# Patient Record
Sex: Female | Born: 1950 | Race: Black or African American | Hispanic: No | Marital: Married | Smoking: Never smoker
Health system: Southern US, Community
[De-identification: ages and names within clinical notes are randomized; demographics above are authoritative.]

## PROBLEM LIST (undated history)

## (undated) DIAGNOSIS — M1990 Unspecified osteoarthritis, unspecified site: Secondary | ICD-10-CM

## (undated) DIAGNOSIS — Z780 Asymptomatic menopausal state: Secondary | ICD-10-CM

## (undated) DIAGNOSIS — E119 Type 2 diabetes mellitus without complications: Secondary | ICD-10-CM

## (undated) DIAGNOSIS — I10 Essential (primary) hypertension: Secondary | ICD-10-CM

## (undated) HISTORY — DX: Asymptomatic menopausal state: Z78.0

## (undated) HISTORY — PX: TUBAL LIGATION: SHX77

## (undated) HISTORY — DX: Unspecified osteoarthritis, unspecified site: M19.90

## (undated) HISTORY — DX: Essential (primary) hypertension: I10

## (undated) HISTORY — DX: Type 2 diabetes mellitus without complications: E11.9

---

## 1999-09-01 ENCOUNTER — Other Ambulatory Visit: Admission: RE | Admit: 1999-09-01 | Discharge: 1999-09-01 | Payer: Self-pay | Admitting: Emergency Medicine

## 2001-08-10 ENCOUNTER — Encounter: Admission: RE | Admit: 2001-08-10 | Discharge: 2001-08-10 | Payer: Self-pay | Admitting: Emergency Medicine

## 2001-08-10 ENCOUNTER — Encounter: Payer: Self-pay | Admitting: Emergency Medicine

## 2002-12-14 ENCOUNTER — Other Ambulatory Visit: Admission: RE | Admit: 2002-12-14 | Discharge: 2002-12-14 | Payer: Self-pay | Admitting: Emergency Medicine

## 2004-04-06 ENCOUNTER — Other Ambulatory Visit: Admission: RE | Admit: 2004-04-06 | Discharge: 2004-04-06 | Payer: Self-pay | Admitting: Emergency Medicine

## 2004-04-22 ENCOUNTER — Encounter: Admission: RE | Admit: 2004-04-22 | Discharge: 2004-04-22 | Payer: Self-pay | Admitting: Emergency Medicine

## 2006-04-08 ENCOUNTER — Other Ambulatory Visit: Admission: RE | Admit: 2006-04-08 | Discharge: 2006-04-08 | Payer: Self-pay | Admitting: Emergency Medicine

## 2006-05-10 ENCOUNTER — Encounter: Admission: RE | Admit: 2006-05-10 | Discharge: 2006-05-10 | Payer: Self-pay | Admitting: Emergency Medicine

## 2011-05-14 ENCOUNTER — Ambulatory Visit (INDEPENDENT_AMBULATORY_CARE_PROVIDER_SITE_OTHER): Payer: BC Managed Care – PPO

## 2011-05-14 ENCOUNTER — Inpatient Hospital Stay (INDEPENDENT_AMBULATORY_CARE_PROVIDER_SITE_OTHER)
Admission: RE | Admit: 2011-05-14 | Discharge: 2011-05-14 | Disposition: A | Discharge status: Home / Self Care | Payer: BC Managed Care – PPO | Source: Ambulatory Visit | Attending: Family Medicine | Admitting: Family Medicine

## 2011-05-14 DIAGNOSIS — T07XXXA Unspecified multiple injuries, initial encounter: Secondary | ICD-10-CM

## 2011-05-14 DIAGNOSIS — IMO0002 Reserved for concepts with insufficient information to code with codable children: Secondary | ICD-10-CM

## 2011-11-04 ENCOUNTER — Institutional Professional Consult (permissible substitution): Payer: Self-pay | Admitting: Cardiology

## 2011-12-02 ENCOUNTER — Institutional Professional Consult (permissible substitution): Payer: Self-pay | Admitting: Cardiology

## 2011-12-02 ENCOUNTER — Encounter: Payer: Self-pay | Admitting: *Deleted

## 2012-11-07 ENCOUNTER — Other Ambulatory Visit: Payer: Self-pay | Admitting: Obstetrics and Gynecology

## 2012-11-07 DIAGNOSIS — R928 Other abnormal and inconclusive findings on diagnostic imaging of breast: Secondary | ICD-10-CM

## 2012-11-14 ENCOUNTER — Ambulatory Visit
Admission: RE | Admit: 2012-11-14 | Discharge: 2012-11-14 | Disposition: A | Discharge status: Home / Self Care | Payer: BC Managed Care – PPO | Source: Ambulatory Visit | Attending: Obstetrics and Gynecology | Admitting: Obstetrics and Gynecology

## 2012-11-14 ENCOUNTER — Other Ambulatory Visit: Payer: Self-pay | Admitting: Obstetrics and Gynecology

## 2012-11-14 DIAGNOSIS — R928 Other abnormal and inconclusive findings on diagnostic imaging of breast: Secondary | ICD-10-CM

## 2012-11-24 ENCOUNTER — Ambulatory Visit
Admission: RE | Admit: 2012-11-24 | Discharge: 2012-11-24 | Disposition: A | Discharge status: Home / Self Care | Payer: BC Managed Care – PPO | Source: Ambulatory Visit | Attending: Obstetrics and Gynecology | Admitting: Obstetrics and Gynecology

## 2012-11-24 DIAGNOSIS — R928 Other abnormal and inconclusive findings on diagnostic imaging of breast: Secondary | ICD-10-CM

## 2013-04-16 ENCOUNTER — Observation Stay (HOSPITAL_COMMUNITY)
Admission: EM | Admit: 2013-04-16 | Discharge: 2013-04-18 | Disposition: A | Discharge status: Home / Self Care | Payer: BC Managed Care – PPO | Attending: Internal Medicine | Admitting: Internal Medicine

## 2013-04-16 ENCOUNTER — Encounter (HOSPITAL_COMMUNITY): Payer: Self-pay | Admitting: *Deleted

## 2013-04-16 ENCOUNTER — Emergency Department (HOSPITAL_COMMUNITY): Payer: BC Managed Care – PPO

## 2013-04-16 DIAGNOSIS — I10 Essential (primary) hypertension: Secondary | ICD-10-CM | POA: Diagnosis present

## 2013-04-16 DIAGNOSIS — R42 Dizziness and giddiness: Principal | ICD-10-CM | POA: Diagnosis present

## 2013-04-16 DIAGNOSIS — E119 Type 2 diabetes mellitus without complications: Secondary | ICD-10-CM | POA: Diagnosis present

## 2013-04-16 DIAGNOSIS — N289 Disorder of kidney and ureter, unspecified: Secondary | ICD-10-CM | POA: Insufficient documentation

## 2013-04-16 DIAGNOSIS — M129 Arthropathy, unspecified: Secondary | ICD-10-CM | POA: Insufficient documentation

## 2013-04-16 DIAGNOSIS — Z794 Long term (current) use of insulin: Secondary | ICD-10-CM | POA: Insufficient documentation

## 2013-04-16 DIAGNOSIS — Z79899 Other long term (current) drug therapy: Secondary | ICD-10-CM | POA: Insufficient documentation

## 2013-04-16 DIAGNOSIS — E1169 Type 2 diabetes mellitus with other specified complication: Secondary | ICD-10-CM | POA: Insufficient documentation

## 2013-04-16 DIAGNOSIS — E162 Hypoglycemia, unspecified: Secondary | ICD-10-CM

## 2013-04-16 LAB — POCT I-STAT, CHEM 8
Creatinine, Ser: 1.3 mg/dL — ABNORMAL HIGH (ref 0.50–1.10)
Glucose, Bld: 58 mg/dL — ABNORMAL LOW (ref 70–99)
Hemoglobin: 13.3 g/dL (ref 12.0–15.0)
Sodium: 142 mEq/L (ref 135–145)
TCO2: 29 mmol/L (ref 0–100)

## 2013-04-16 LAB — URINE MICROSCOPIC-ADD ON

## 2013-04-16 LAB — URINALYSIS, ROUTINE W REFLEX MICROSCOPIC
Bilirubin Urine: NEGATIVE
Hgb urine dipstick: NEGATIVE
Protein, ur: NEGATIVE mg/dL
Urobilinogen, UA: 0.2 mg/dL (ref 0.0–1.0)

## 2013-04-16 MED ORDER — MECLIZINE HCL 25 MG PO TABS
25.0000 mg | ORAL_TABLET | Freq: Once | ORAL | Status: AC
  Administered 2013-04-16: 25 mg via ORAL
  Filled 2013-04-16: qty 1

## 2013-04-16 NOTE — ED Notes (Signed)
Pt c/o dizziness and feeling off balance that began this afternoon; denies nausea; pt states the dizziness is worse upon movement.

## 2013-04-16 NOTE — ED Provider Notes (Signed)
History     CSN: [id number]  Arrival date & time 04/16/13  2038   First MD Initiated Contact with Patient 04/16/13 2307      Chief Complaint  Patient presents with  . Dizziness    (Consider location/radiation/quality/duration/timing/severity/associated sxs/prior treatment) The history is provided by the patient.   62 year old female had onset about 4:30 PM of feeling dizzy. She describes a dizzy feeling as being off balance and she tended to fall to the right. She has not actually fallen. There is no associated nausea or vomiting. She denies any headache or ear pain or decreased hearing or tinnitus. Dizziness is worse with movement. Nothing makes it better. Around the same time, she developed a mild pain across her lower back with associated dysuria. She denies urinary urgency, frequency, tenesmus. Pain is mild and she rated at 1/10. Nothing makes it better nothing makes it worse. She has not taken anything for any of these symptoms. She's concerned because similar symptoms in the past were due to urinary tract infection.  Past Medical History  Diagnosis Date  . DM (diabetes mellitus)   . HTN (hypertension)   . Postmenopausal   . Arthritis     Past Surgical History  Procedure Laterality Date  . Tubal ligation      1982    No family history on file.  History  Substance Use Topics  . Smoking status: Never Smoker   . Smokeless tobacco: Never Used  . Alcohol Use: No    OB History   Grav Para Term Preterm Abortions TAB SAB Ect Mult Living                  Review of Systems  All other systems reviewed and are negative.    Allergies  Fluticasone propionate and Prinivil  Home Medications   Current Outpatient Rx  Name  Route  Sig  Dispense  Refill  . cetirizine (ZYRTEC) 10 MG tablet   Oral   Take 10 mg by mouth daily.         . insulin lispro protamine-insulin lispro (HUMALOG 75/25) (75-25) 100 UNIT/ML SUSP   Subcutaneous   Inject 40-45 Units into the skin 2  (two) times daily with a meal. 40 units in the AM  45 units in the PM         . metFORMIN (GLUCOPHAGE) 500 MG tablet   Oral   Take 500 mg by mouth 2 (two) times daily with a meal.         . omega-3 acid ethyl esters (LOVAZA) 1 G capsule   Oral   Take 1 g by mouth 2 (two) times daily.         . Losartan Potassium (COZAAR PO)   Oral   Take by mouth daily.         . Multiple Vitamin (MULTIVITAMIN WITH MINERALS) TABS   Oral   Take 1 tablet by mouth daily.           BP 169/88  Pulse 74  Temp(Src) 97.9 F (36.6 C) (Oral)  Resp 20  Ht 5\' 5"  (1.651 m)  Wt 254 lb (115.214 kg)  BMI 42.27 kg/m2  SpO2 100%  Physical Exam  Nursing note and vitals reviewed.  62 year old female, resting comfortably and in no acute distress. Vital signs are significant for hypertension with blood pressure 169/88. Oxygen saturation is 100%, which is normal. Head is normocephalic and atraumatic. PERRLA, EOMI. Oropharynx is clear. Neck is nontender and  supple without adenopathy or JVD. Back is nontender and there is no CVA tenderness. Lungs are clear without rales, wheezes, or rhonchi. Chest is nontender. Heart has regular rate and rhythm without murmur. Abdomen is soft, flat, nontender without masses or hepatosplenomegaly and peristalsis is normoactive. Extremities have no cyanosis or edema, full range of motion is present. Skin is warm and dry without rash. Neurologic: Mental status is normal, cranial nerves are intact, there are no motor or sensory deficits. Finger to nose testing is normal. Dizziness is reproduced by head movement. Romberg is positive for tendency to fall to the right.  ED Course  Procedures (including critical care time)  Results for orders placed during the hospital encounter of 04/16/13  GLUCOSE, CAPILLARY      Result Value Range   Glucose-Capillary 78  70 - 99 mg/dL  URINALYSIS, ROUTINE W REFLEX MICROSCOPIC      Result Value Range   Color, Urine YELLOW  YELLOW    APPearance CLEAR  CLEAR   Specific Gravity, Urine 1.016  1.005 - 1.030   pH 8.0  5.0 - 8.0   Glucose, UA NEGATIVE  NEGATIVE mg/dL   Hgb urine dipstick NEGATIVE  NEGATIVE   Bilirubin Urine NEGATIVE  NEGATIVE   Ketones, ur NEGATIVE  NEGATIVE mg/dL   Protein, ur NEGATIVE  NEGATIVE mg/dL   Urobilinogen, UA 0.2  0.0 - 1.0 mg/dL   Nitrite NEGATIVE  NEGATIVE   Leukocytes, UA SMALL (*) NEGATIVE  GLUCOSE, CAPILLARY      Result Value Range   Glucose-Capillary 32 (*) 70 - 99 mg/dL   Comment 1 Documented in Chart     Comment 2 Notify RN    URINE MICROSCOPIC-ADD ON      Result Value Range   Squamous Epithelial / LPF RARE  RARE   WBC, UA 3-6  <3 WBC/hpf   RBC / HPF 0-2  <3 RBC/hpf   Bacteria, UA RARE  RARE  BASIC METABOLIC PANEL      Result Value Range   Sodium 139  135 - 145 mEq/L   Potassium 3.0 (*) 3.5 - 5.1 mEq/L   Chloride 98  96 - 112 mEq/L   CO2 28  19 - 32 mEq/L   Glucose, Bld 42 (*) 70 - 99 mg/dL   BUN 26 (*) 6 - 23 mg/dL   Creatinine, Ser 1.61 (*) 0.50 - 1.10 mg/dL   Calcium 09.6  8.4 - 04.5 mg/dL   GFR calc non Af Amer 48 (*) >90 mL/min   GFR calc Af Amer 56 (*) >90 mL/min  GLUCOSE, CAPILLARY      Result Value Range   Glucose-Capillary 109 (*) 70 - 99 mg/dL  POCT I-STAT, CHEM 8      Result Value Range   Sodium 142  135 - 145 mEq/L   Potassium 3.5  3.5 - 5.1 mEq/L   Chloride 103  96 - 112 mEq/L   BUN 27 (*) 6 - 23 mg/dL   Creatinine, Ser 4.09 (*) 0.50 - 1.10 mg/dL   Glucose, Bld 58 (*) 70 - 99 mg/dL   Calcium, Ion 8.11  9.14 - 1.23 mmol/L   TCO2 29  0 - 100 mmol/L   Hemoglobin 13.3  12.0 - 15.0 g/dL   HCT 78.2  95.6 - 21.3 %   Ct Head Wo Contrast  04/16/2013   *RADIOLOGY REPORT*  Clinical Data: Dizziness.  CT HEAD WITHOUT CONTRAST  Technique:  Contiguous axial images were obtained from the base of the  skull through the vertex without contrast.  Comparison: No priors.  Findings: Faint physiologic calcifications in the basal ganglia bilaterally.  Prominent dural  calcifications in the falx cerebri. No acute intracranial abnormalities.  Specifically, no evidence of acute intracranial hemorrhage, no definite findings of acute/subacute cerebral ischemia, no mass, mass effect, hydrocephalus or abnormal intra or extra-axial fluid collections. Visualized paranasal sinuses and mastoids are well pneumatized, with exception of some mild multifocal mucosal thickening throughout the ethmoid sinuses bilaterally (right greater than left).  No acute displaced skull fractures are identified.  IMPRESSION: 1.  No acute intracranial abnormalities. 2.  The appearance of brain is normal. 3.  Mild paranasal sinus disease without acute features, as above.   Original Report Authenticated By: Trudie Reed, M.D.      Date: 04/16/2013  Rate: 73  Rhythm: normal sinus rhythm  QRS Axis: normal  Intervals: normal  ST/T Wave abnormalities: nonspecific T wave changes  Conduction Disutrbances:none  Narrative Interpretation: Left atrial hypertrophy, nonspecific T wave flattening. No prior ECG available for comparison.  Old EKG Reviewed: none available  1. Vertigo   2. Hypoglycemia   3. Renal insufficiency       MDM  Vertigo which has components of both central and peripheral vertigo. She will be given a therapeutic trial of meclizine and CT of the head obtained. I suspect that she will need MRI to definitively determine whether she has had a posterior circulation stroke. Initial workup at [facility] is back and she has mild renal insufficiency with BUN of 27 creatinine 1.3 and no prior values to compare with. Blood sugar was 58 on i-STAT and 78 on CBG testing. CBG will be repeated. Urinalysis will need to be obtained to rule out urinary tract infection.   repeat blood sugars come back 32. She is given IV dextrose and given a snack high blood sugars come up to 109. She is also given a dose of meclizine. Following this, she feels somewhat improved but not back to baseline. She will need  to be admitted under observation status in order to get an MRI scan to rule out posterior circulation stroke. Case is discussed with Dr. Eben Burow of [facility] who agrees to admit the patient.       Dione Booze, MD 04/17/13 [phone]

## 2013-04-17 ENCOUNTER — Observation Stay (HOSPITAL_COMMUNITY): Payer: BC Managed Care – PPO

## 2013-04-17 DIAGNOSIS — R42 Dizziness and giddiness: Secondary | ICD-10-CM | POA: Diagnosis present

## 2013-04-17 DIAGNOSIS — E119 Type 2 diabetes mellitus without complications: Secondary | ICD-10-CM

## 2013-04-17 DIAGNOSIS — I10 Essential (primary) hypertension: Secondary | ICD-10-CM | POA: Diagnosis present

## 2013-04-17 DIAGNOSIS — N289 Disorder of kidney and ureter, unspecified: Secondary | ICD-10-CM

## 2013-04-17 LAB — BASIC METABOLIC PANEL
GFR calc Af Amer: 56 mL/min — ABNORMAL LOW (ref >90)
GFR calc non Af Amer: 48 mL/min — ABNORMAL LOW (ref >90)
Potassium: 3 mEq/L — ABNORMAL LOW (ref 3.5–5.1)
Sodium: 139 mEq/L (ref 135–145)

## 2013-04-17 LAB — GLUCOSE, CAPILLARY: Glucose-Capillary: 207 mg/dL — ABNORMAL HIGH (ref 70–99)

## 2013-04-17 MED ORDER — SODIUM CHLORIDE 0.9 % IV SOLN: 250.0000 mL | INTRAVENOUS | Status: DC | PRN

## 2013-04-17 MED ORDER — SODIUM CHLORIDE 0.9 % IJ SOLN: 3.0000 mL | INTRAMUSCULAR | Status: DC | PRN

## 2013-04-17 MED ORDER — ENOXAPARIN SODIUM 40 MG/0.4ML ~~LOC~~ SOLN
40.0000 mg | SUBCUTANEOUS | Status: DC
  Administered 2013-04-17: 40 mg via SUBCUTANEOUS
  Filled 2013-04-17 (×2): qty 0.4

## 2013-04-17 MED ORDER — INSULIN ASPART 100 UNIT/ML ~~LOC~~ SOLN
0.0000 [IU] | Freq: Three times a day (TID) | SUBCUTANEOUS | Status: DC
  Administered 2013-04-17: 1 [IU] via SUBCUTANEOUS
  Administered 2013-04-17: 2 [IU] via SUBCUTANEOUS
  Administered 2013-04-17: 3 [IU] via SUBCUTANEOUS
  Administered 2013-04-18: 2 [IU] via SUBCUTANEOUS

## 2013-04-17 MED ORDER — POTASSIUM CHLORIDE CRYS ER 20 MEQ PO TBCR
40.0000 meq | EXTENDED_RELEASE_TABLET | Freq: Two times a day (BID) | ORAL | Status: AC
  Administered 2013-04-17: 40 meq via ORAL
  Filled 2013-04-17 (×2): qty 2

## 2013-04-17 MED ORDER — INSULIN ASPART 100 UNIT/ML ~~LOC~~ SOLN
3.0000 [IU] | Freq: Three times a day (TID) | SUBCUTANEOUS | Status: DC
  Administered 2013-04-17 – 2013-04-18 (×4): 3 [IU] via SUBCUTANEOUS

## 2013-04-17 MED ORDER — SODIUM CHLORIDE 0.9 % IJ SOLN
3.0000 mL | Freq: Two times a day (BID) | INTRAMUSCULAR | Status: DC
  Administered 2013-04-17: 3 mL via INTRAVENOUS

## 2013-04-17 MED ORDER — LOSARTAN POTASSIUM 50 MG PO TABS
50.0000 mg | ORAL_TABLET | Freq: Every day | ORAL | Status: DC
  Administered 2013-04-17: 50 mg via ORAL
  Filled 2013-04-17 (×2): qty 1

## 2013-04-17 MED ORDER — SODIUM CHLORIDE 0.9 % IV SOLN: INTRAVENOUS | Status: DC

## 2013-04-17 MED ORDER — DEXTROSE 50 % IV SOLN
12.5000 g | Freq: Once | INTRAVENOUS | Status: AC
  Administered 2013-04-17: 12.5 g via INTRAVENOUS
  Filled 2013-04-17: qty 50

## 2013-04-17 MED ORDER — ALUM & MAG HYDROXIDE-SIMETH 200-200-20 MG/5ML PO SUSP
30.0000 mL | ORAL | Status: DC | PRN
  Administered 2013-04-17: 30 mL via ORAL
  Filled 2013-04-17: qty 30

## 2013-04-17 MED ORDER — ASPIRIN EC 81 MG PO TBEC
81.0000 mg | DELAYED_RELEASE_TABLET | Freq: Every day | ORAL | Status: DC
  Administered 2013-04-17: 81 mg via ORAL
  Filled 2013-04-17 (×2): qty 1

## 2013-04-17 MED ORDER — LORAZEPAM 2 MG/ML IJ SOLN
1.0000 mg | Freq: Once | INTRAMUSCULAR | Status: DC
  Filled 2013-04-17: qty 1

## 2013-04-17 NOTE — H&P (Signed)
History and Physical  Diane Jordan OZH:[id number] DOB: [birth year] DOA: 04/16/2013  Referring physician: Dr. Preston Fleeting PCP: Darrow Bussing, MD   Chief Complaint: Vertigo   HPI: Patient is a 62 year old female with past medical history of type 2 diabetes, hypertension and arthritis who presented to [facility] long emergency room with dizziness. Patient said that she feels off balance but has not actually fallen. Patient denies any localized weakness or numbness. She denies any headaches, ear pain, ear discharge, tinnitus or decreased hearing. Patient sees that her dizziness is worse with movement. There are no relieving factors.  She has had previous dizziness which was associated with urinary tract infection at that time.  Initially an evaluation including CT scan of the head has been negative. Patient was found to be hypoglycemic in the ER.  15 point review of system is negative except what is noted above in the history of present illness.  Past Medical History  Diagnosis Date  . DM (diabetes mellitus)   . HTN (hypertension)   . Postmenopausal   . Arthritis     Past Surgical History  Procedure Laterality Date  . Tubal ligation      1982    Social History:  reports that she has never smoked. She has never used smokeless tobacco. She reports that she does not drink alcohol or use illicit drugs.  Allergies  Allergen Reactions  . Fluticasone Propionate Itching  . Prinivil (Lisinopril) Itching    No family history on file.   Prior to Admission medications   Medication Sig Start Date End Date Taking? Authorizing Provider  cetirizine (ZYRTEC) 10 MG tablet Take 10 mg by mouth daily.   Yes Historical Provider, MD  insulin lispro protamine-insulin lispro (HUMALOG 75/25) (75-25) 100 UNIT/ML SUSP Inject 40-45 Units into the skin 2 (two) times daily with a meal. 40 units in the AM  45 units in the PM   Yes Historical Provider, MD  metFORMIN (GLUCOPHAGE) 500 MG tablet Take 500 mg by mouth 2  (two) times daily with a meal.   Yes Historical Provider, MD  omega-3 acid ethyl esters (LOVAZA) 1 G capsule Take 1 g by mouth 2 (two) times daily.   Yes Historical Provider, MD  Losartan Potassium (COZAAR PO) Take by mouth daily.    Historical Provider, MD  Multiple Vitamin (MULTIVITAMIN WITH MINERALS) TABS Take 1 tablet by mouth daily.    Historical Provider, MD   Physical Exam: Filed Vitals:   04/16/13 2108 04/16/13 2350 04/16/13 2352 04/16/13 2355  BP: 169/88 168/80 169/81 164/86  Pulse: 74 92 104 98  Temp: 97.9 F (36.6 C)     TempSrc: Oral     Resp: 20     Height: 5\' 5"  (1.651 m)     Weight: 254 lb (115.214 kg)     SpO2: 100%      Physical Exam: General: Vital signs reviewed and noted. Well-developed, well-nourished, in no acute distress; alert, appropriate and cooperative throughout examination.  Head: Normocephalic, atraumatic.  Eyes: PERRL, EOMI, No signs of anemia or jaundince.  Nose: Mucous membranes moist, not inflammed, nonerythematous.  Throat: Oropharynx nonerythematous, no exudate appreciated.   Neck: No deformities, masses, or tenderness noted.Supple, No carotid Bruits, no JVD.  Lungs:  Normal respiratory effort. Clear to auscultation BL without crackles or wheezes.  Heart: RRR. S1 and S2 normal without gallop, murmur, or rubs.  Abdomen:  BS normoactive. Soft, Nondistended, non-tender.  No masses or organomegaly.  Extremities: No pretibial edema.  Neurologic: A&O X3, CN  II - XII are grossly intact. Motor strength is 5/5 in the all 4 extremities, Sensations intact to light touch, Cerebellar signs negative.  Skin: No visible rashes, scars.     Wt Readings from Last 3 Encounters:  04/16/13 254 lb (115.214 kg)    Labs on Admission:  Basic Metabolic Panel:  Recent Labs Lab 04/16/13 2210 04/16/13 2215  NA 139 142  K 3.0* 3.5  CL 98 103  CO2 28  --   GLUCOSE 42* 58*  BUN 26* 27*  CREATININE 1.20* 1.30*  CALCIUM 10.2  --    CBC:  Recent Labs Lab  04/16/13 2215  HGB 13.3  HCT 39.0    CBG:  Recent Labs Lab 04/16/13 2108 04/16/13 2345 04/17/13 0104  GLUCAP 78 32* 109*     Radiological Exams on Admission: Ct Head Wo Contrast  04/16/2013   *RADIOLOGY REPORT*  Clinical Data: Dizziness.  CT HEAD WITHOUT CONTRAST  Technique:  Contiguous axial images were obtained from the base of the skull through the vertex without contrast.  Comparison: No priors.  Findings: Faint physiologic calcifications in the basal ganglia bilaterally.  Prominent dural calcifications in the falx cerebri. No acute intracranial abnormalities.  Specifically, no evidence of acute intracranial hemorrhage, no definite findings of acute/subacute cerebral ischemia, no mass, mass effect, hydrocephalus or abnormal intra or extra-axial fluid collections. Visualized paranasal sinuses and mastoids are well pneumatized, with exception of some mild multifocal mucosal thickening throughout the ethmoid sinuses bilaterally (right greater than left).  No acute displaced skull fractures are identified.  IMPRESSION: 1.  No acute intracranial abnormalities. 2.  The appearance of brain is normal. 3.  Mild paranasal sinus disease without acute features, as above.   Original Report Authenticated By: Trudie Reed, M.D.    EKG: Independently reviewed. 73 beats per minute, normal axis, sinus rhythm, no ST or T wave changes noted, normal EKG, no previous EKG available for comparison  Principal Problem:   Vertigo Active Problems:   Type 2 diabetes mellitus   Hypertension   Assessment/Plan Patient is a 62 year old female with past medical history of type 2 diabetes, hypertension who presented with acute onset of dizziness. Initial evaluation in the ER was suggestive of chronic kidney disease and hypokalemia. CT scan of the head is negative for any acute abnormalities. Patient does have demonstrable dizziness while moving her head which is suggestive of benign positional vertigo.  ER  Physician Dr. Preston Fleeting, wants to admit the patient for observation and get an MRI done to rule out a posterior circulation stroke as a cause of patient's vertigo. -MRI head -Replete potassium -Sliding scale insulin -IV fluids -Restart home medications except diuretic -PT consult for vestibular rehab -check orthostatics Q shift   Code Status: Full code Family Communication: Updated at bedside Disposition Plan/Anticipated LOS: Guarded  Time spent: 55 minutes  Lars Mage, MD  [facility] Team 5  If 7PM-7AM, please contact night-coverage at [email or web address], password [facility] 04/17/2013, 2:01 AM

## 2013-04-17 NOTE — Progress Notes (Signed)
Patient admitted early this AM by Dr. Eben Burow.  MRI negative Await PT eval, continue IVF and check CR in AM  Marlin Canary DO

## 2013-04-17 NOTE — ED Notes (Signed)
Report called to the Rn on the floor. The patient remains alert and oriented without any new complaints.

## 2013-04-17 NOTE — Evaluation (Addendum)
Physical Therapy Evaluation Patient Details Name: NIVA MURREN MRN: [id number] DOB: 03-22-1951 Today's Date: 04/17/2013 Time: 4098-1191 PT Time Calculation (min): 22 min  PT Assessment / Plan / Recommendation Clinical Impression  Pt is a 62 year old female admitted for dizziness.  Pt reports her dizziness is a feeling of being "off-balance" and occurs with position changes; relieved with sitting.  Pt denies falling, hitting head, spinning sensation, tinnitus, and hearing loss.  Pt also with normal smooth pursuits, negative orthostatics and negative modified Dix Hallpike on right and left (not likely to be BPPV).  Pt with reported dizziness with saccades, VOR, head thrust, head shaking, and VOR cancellation however unable to see if nystagmus present as pt unable to keep eyes open.   Pt also feeling "off balance" during ambulation so recommended neurorehab outpatient and given referral sheet as well as 2 vestibular exercises (saccades and head shaking).  Will f/u with pt if remains in acute care otherwise can f/u with outpatient.    PT Assessment  Patient needs continued PT services    Follow Up Recommendations  Outpatient PT    Does the patient have the potential to tolerate intense rehabilitation      Barriers to Discharge        Equipment Recommendations  None recommended by PT    Recommendations for Other Services     Frequency Min 3X/week    Precautions / Restrictions Precautions Precautions: Fall   Pertinent Vitals/Pain Orthostatics:  Supine: 144/81 mmHg 92 bpm Sitting: 142/95 mmHg 100 bpm  Standing: 144/94 mmHg 110 bpm     Mobility  Bed Mobility Bed Mobility: Supine to Sit;Sit to Supine Supine to Sit: 6: Modified independent (Device/Increase time) Sit to Supine: 6: Modified independent (Device/Increase time) Transfers Transfers: Sit to Stand;Stand to Sit Sit to Stand: 4: Min guard;From bed;With upper extremity assist Stand to Sit: 4: Min guard;To bed;With upper  extremity assist Details for Transfer Assistance: min/guard due to vertigo Ambulation/Gait Ambulation/Gait Assistance: 4: Min guard Ambulation Distance (Feet): 380 Feet Assistive device: None Ambulation/Gait Assistance Details: 2 episodes of LOB however pt able to self correct, pt states that is what was happening at home with her dizzines however no dizziness during ambulation just feeling of being "off-balance" Gait Pattern: Step-through pattern    Exercises     PT Diagnosis: Difficulty walking  PT Problem List: Decreased balance;Decreased mobility;Other (comment) (vertigo) PT Treatment Interventions: DME instruction;Gait training;Functional mobility training;Therapeutic exercise;Therapeutic activities;Other (comment);Balance training;Neuromuscular re-education (visual exercises, habituation exercises)   PT Goals Acute Rehab PT Goals PT Goal Formulation: With patient Time For Goal Achievement: 04/24/13 Potential to Achieve Goals: Good Pt will Ambulate: 51 - 150 feet;with modified independence (no LOB) PT Goal: Ambulate - Progress: Goal set today Additional Goals Additional Goal #1: Pt will demonstrate vestibular exercises without cues. PT Goal: Additional Goal #1 - Progress: Goal set today  Visit Information  Last PT Received On: 04/17/13 Assistance Needed: +1    Subjective Data  Subjective: It feels like I'm off balance.   Prior Functioning  Home Living Lives With: Spouse Type of Home: House Home Layout: Two level Alternate Level Stairs-Number of Steps: flight Alternate Level Stairs-Rails: Right Home Adaptive Equipment: None Prior Function Level of Independence: Independent Communication Communication: No difficulties    Cognition  Cognition Arousal/Alertness: Awake/alert Behavior During Therapy: WFL for tasks assessed/performed Overall Cognitive Status: Within Functional Limits for tasks assessed    Extremity/Trunk Assessment Right Upper Extremity  Assessment RUE ROM/Strength/Tone: [facility] for tasks assessed Left Upper  Extremity Assessment LUE ROM/Strength/Tone: [facility] for tasks assessed Right Lower Extremity Assessment RLE ROM/Strength/Tone: [facility] for tasks assessed Left Lower Extremity Assessment LLE ROM/Strength/Tone: [facility] for tasks assessed Trunk Assessment Trunk Assessment: Normal   Balance    End of Session PT - End of Session Activity Tolerance: Patient tolerated treatment well Patient left: in bed;with call bell/phone within reach;with family/visitor present Nurse Communication: Mobility status  GP Functional Assessment Tool Used: clinical judgement, negative modified dix hallpike Functional Limitation: Mobility: Walking and moving around Mobility: Walking and Moving Around Current Status ([id number]): At least 1 percent but less than 20 percent impaired, limited or restricted Mobility: Walking and Moving Around Goal Status [phone]): 0 percent impaired, limited or restricted   Rilea Arutyunyan,KATHrine E 04/17/2013, 1:27 PM Zenovia Jarred, PT, DPT 04/17/2013 Pager: [phone]

## 2013-04-18 DIAGNOSIS — E119 Type 2 diabetes mellitus without complications: Secondary | ICD-10-CM

## 2013-04-18 DIAGNOSIS — N289 Disorder of kidney and ureter, unspecified: Secondary | ICD-10-CM

## 2013-04-18 DIAGNOSIS — I10 Essential (primary) hypertension: Secondary | ICD-10-CM

## 2013-04-18 LAB — BASIC METABOLIC PANEL
CO2: 26 mEq/L (ref 19–32)
Glucose, Bld: 165 mg/dL — ABNORMAL HIGH (ref 70–99)
Potassium: 4.1 mEq/L (ref 3.5–5.1)
Sodium: 139 mEq/L (ref 135–145)

## 2013-04-18 LAB — CBC
HCT: 35 % — ABNORMAL LOW (ref 36.0–46.0)
Hemoglobin: 11.9 g/dL — ABNORMAL LOW (ref 12.0–15.0)
RBC: 4.07 MIL/uL (ref 3.87–5.11)

## 2013-04-18 NOTE — Progress Notes (Signed)
Physician Discharge Summary  Diane Jordan ZOX:[id number] DOB: 09-17-1951 DOA: 04/16/2013  PCP: Darrow Bussing, MD  Admit date: 04/16/2013 Discharge date: 04/18/2013  Time spent: 35 minutes  Recommendations for Outpatient Follow-up:  1. Neuro rehab per recs by PT  Discharge Diagnoses:  Principal Problem:   Vertigo Active Problems:   Type 2 diabetes mellitus   Hypertension   Discharge Condition: improved  Diet recommendation: diabetic  Filed Weights   04/16/13 2108 04/17/13 0301  Weight: 115.214 kg (254 lb) 117.8 kg (259 lb 11.2 oz)    History of present illness:  Patient is a 62 year old female with past medical history of type 2 diabetes, hypertension and arthritis who presented to [facility] long emergency room with dizziness. Patient said that she feels off balance but has not actually fallen. Patient denies any localized weakness or numbness. She denies any headaches, ear pain, ear discharge, tinnitus or decreased hearing. Patient sees that her dizziness is worse with movement. There are no relieving factors.  She has had previous dizziness which was associated with urinary tract infection at that time.  Initially an evaluation including CT scan of the head has been negative.  Patient was found to be hypoglycemic in the ER.  15 point review of system is negative except what is noted above in the history of present illness.   Hospital Course:  AKI- IVF resolved  Dizziness- resolved, MRI negative    Procedures: MRI  Consultations:  none  Discharge Exam: Filed Vitals:   04/18/13 8119 04/18/13 0629 04/18/13 0632 04/18/13 0850  BP: 147/97 144/104 150/100   Pulse: 84 91 97 80  Temp:      TempSrc:      Resp:    18  Height:      Weight:      SpO2:        General: A+Ox3, NAD Cardiovascular: rrr Respiratory: clear anterior  Discharge Instructions  Discharge Orders   Future Orders Complete By Expires     Diet - low sodium heart healthy  As directed     Diet  Carb Modified  As directed     Discharge instructions  As directed     Comments:      Neuro-rehab as ouatient    Increase activity slowly  As directed         Medication List    TAKE these medications       cetirizine 10 MG tablet  Commonly known as:  ZYRTEC  Take 10 mg by mouth daily.     COZAAR PO  Take by mouth daily.     insulin lispro protamine-lispro (75-25) 100 UNIT/ML Susp  Commonly known as:  HUMALOG 75/25  Inject 40-45 Units into the skin 2 (two) times daily with a meal. 40 units in the AM   45 units in the PM     metFORMIN 500 MG tablet  Commonly known as:  GLUCOPHAGE  Take 500 mg by mouth 2 (two) times daily with a meal.     multivitamin with minerals Tabs  Take 1 tablet by mouth daily.     omega-3 acid ethyl esters 1 G capsule  Commonly known as:  LOVAZA  Take 1 g by mouth 2 (two) times daily.       Allergies  Allergen Reactions  . Fluticasone Propionate Itching  . Prinivil (Lisinopril) Itching       Follow-up Information   Follow up with Darrow Bussing, MD In 1 week.   Contact information:   [address] [place] [address] [phone]        The results of significant diagnostics from this hospitalization (including imaging, microbiology, ancillary and laboratory) are listed below for reference.    Significant Diagnostic Studies: Ct Head Wo Contrast  04/16/2013   *RADIOLOGY REPORT*  Clinical Data: Dizziness.  CT HEAD WITHOUT CONTRAST  Technique:  Contiguous axial images were obtained from the base of the skull through the vertex without contrast.  Comparison: No priors.  Findings: Faint physiologic calcifications in the basal ganglia bilaterally.  Prominent dural calcifications in the falx cerebri. No acute intracranial abnormalities.  Specifically, no evidence of acute intracranial hemorrhage, no definite findings of acute/subacute cerebral ischemia, no mass, mass effect, hydrocephalus or abnormal intra or extra-axial fluid  collections. Visualized paranasal sinuses and mastoids are well pneumatized, with exception of some mild multifocal mucosal thickening throughout the ethmoid sinuses bilaterally (right greater than left).  No acute displaced skull fractures are identified.  IMPRESSION: 1.  No acute intracranial abnormalities. 2.  The appearance of brain is normal. 3.  Mild paranasal sinus disease without acute features, as above.   Original Report Authenticated By: Trudie Reed, M.D.   Mr Brain Wo Contrast  04/17/2013   *RADIOLOGY REPORT*  Clinical Data: Persistent dizziness.  Diabetes and hypertension. Rule out stroke.  MRI HEAD WITHOUT CONTRAST  Technique:  Multiplanar, multiecho pulse sequences of the brain and surrounding structures were obtained according to standard protocol without intravenous contrast.  Comparison: CT 04/16/2013  Findings: Negative for acute infarct.  Small hyperintensity right frontal Debes matter appears chronic.  The remaining Helvie matter and brainstem are normal.  Cerebellum is normal.  Ventricle size is normal.  Negative for hemorrhage or fluid collection.  No mass or edema.  Mucosal thickening in the paranasal sinuses without air-fluid level.  IMPRESSION: No acute abnormality.   Original Report Authenticated By: Janeece Riggers, M.D.    Microbiology: No results found for this or any previous visit (from the past 240 hour(s)).   Labs: Basic Metabolic Panel:  Recent Labs Lab 04/16/13 2210 04/16/13 2215 04/18/13 0549  NA 139 142 139  K 3.0* 3.5 4.1  CL 98 103 102  CO2 28  --  26  GLUCOSE 42* 58* 165*  BUN 26* 27* 19  CREATININE 1.20* 1.30* 1.00  CALCIUM 10.2  --  9.8   Liver Function Tests: No results found for this basename: AST, ALT, ALKPHOS, BILITOT, PROT, ALBUMIN,  in the last 168 hours No results found for this basename: LIPASE, AMYLASE,  in the last 168 hours No results found for this basename: AMMONIA,  in the last 168 hours CBC:  Recent Labs Lab 04/16/13 2215  04/18/13 0549  WBC  --  5.6  HGB 13.3 11.9*  HCT 39.0 35.0*  MCV  --  86.0  PLT  --  331   Cardiac Enzymes: No results found for this basename: CKTOTAL, CKMB, CKMBINDEX, TROPONINI,  in the last 168 hours BNP: BNP (last 3 results) No results found for this basename: PROBNP,  in the last 8760 hours CBG:  Recent Labs Lab 04/17/13 0831 04/17/13 1121 04/17/13 1645 04/17/13 2205 04/18/13 0726  GLUCAP 133* 207* 196* 242* 159*       Signed:  Virgin Zellers  [facility] 04/18/2013, 11:30 AM

## 2013-04-18 NOTE — Progress Notes (Signed)
Orthostatic Vitals taken this am. Noted slightly elevated B/P's: lying @ 159/89, sitting @ 147/97, standing @ 150/100. Will follow up with day shift nurse. Noted patient due for Hypertensive medications this morning. No report of vertigo from patient during orthostatic vital assessment this am. Will continue to monitor patient per Doctor order and unit protocol.

## 2013-04-18 NOTE — Progress Notes (Signed)
Referral paper work for Neurorehabilitation faxed to the Neurorehabilitation center on [address]. They will contact patient to set up an appointment.  Algernon Huxley RN BSN  [phone]

## 2013-04-18 NOTE — Progress Notes (Signed)
Patient was given discharge instructions including instructions for follow up instructions. No prescriptions to be given. Patient and spouse verbalized understanding of discharge instructions. Patient was escorted to personal vehicle with student nurse.

## 2013-04-25 NOTE — Discharge Summary (Signed)
Physician Discharge Summary  Diane Jordan YQM:[id number] DOB: 1951/06/20 DOA: 04/16/2013  PCP: Darrow Bussing, MD  Admit date: 04/16/2013 Discharge date: 04/25/2013  Time spent: 35 minutes  Recommendations for Outpatient Follow-up:  1. Neuro rehab per recs by PT  Discharge Diagnoses:  Principal Problem:   Vertigo Active Problems:   Type 2 diabetes mellitus   Hypertension   Discharge Condition: improved  Diet recommendation: diabetic  Filed Weights   04/16/13 2108 04/17/13 0301  Weight: 115.214 kg (254 lb) 117.8 kg (259 lb 11.2 oz)    History of present illness:  Patient is a 62 year old female with past medical history of type 2 diabetes, hypertension and arthritis who presented to [facility] long emergency room with dizziness. Patient said that she feels off balance but has not actually fallen. Patient denies any localized weakness or numbness. She denies any headaches, ear pain, ear discharge, tinnitus or decreased hearing. Patient sees that her dizziness is worse with movement. There are no relieving factors.  She has had previous dizziness which was associated with urinary tract infection at that time.  Initially an evaluation including CT scan of the head has been negative.  Patient was found to be hypoglycemic in the ER.  15 point review of system is negative except what is noted above in the history of present illness.   Hospital Course:  AKI- IVF resolved  Dizziness- resolved, MRI negative    Procedures: MRI  Consultations:  none  Discharge Exam: Filed Vitals:   04/18/13 5284 04/18/13 0629 04/18/13 0632 04/18/13 0850  BP: 147/97 144/104 150/100   Pulse: 84 91 97 80  Temp:      TempSrc:      Resp:    18  Height:      Weight:      SpO2:        General: A+Ox3, NAD Cardiovascular: rrr Respiratory: clear anterior  Discharge Instructions      Discharge Orders   Future Orders Complete By Expires     Diet - low sodium heart healthy  As directed     Diet Carb Modified  As directed     Discharge instructions  As directed     Comments:      Neuro-rehab as ouatient    Increase activity slowly  As directed         Medication List    TAKE these medications       cetirizine 10 MG tablet  Commonly known as:  ZYRTEC  Take 10 mg by mouth daily.     COZAAR PO  Take by mouth daily.     insulin lispro protamine-lispro (75-25) 100 UNIT/ML Susp  Commonly known as:  HUMALOG 75/25  Inject 40-45 Units into the skin 2 (two) times daily with a meal. 40 units in the AM   45 units in the PM     metFORMIN 500 MG tablet  Commonly known as:  GLUCOPHAGE  Take 500 mg by mouth 2 (two) times daily with a meal.     multivitamin with minerals Tabs  Take 1 tablet by mouth daily.     omega-3 acid ethyl esters 1 G capsule  Commonly known as:  LOVAZA  Take 1 g by mouth 2 (two) times daily.       Allergies  Allergen Reactions  . Fluticasone Propionate Itching  . Prinivil (Lisinopril) Itching   Follow-up Information   Follow up with Darrow Bussing, MD In 1 week.   Contact information:   [address] [place] [address] [phone]        The results of significant diagnostics from this hospitalization (including imaging, microbiology, ancillary and laboratory) are listed below for reference.    Significant Diagnostic Studies: Ct Head Wo Contrast  04/16/2013   *RADIOLOGY REPORT*  Clinical Data: Dizziness.  CT HEAD WITHOUT CONTRAST  Technique:  Contiguous axial images were obtained from the base of the skull through the vertex without contrast.  Comparison: No priors.  Findings: Faint physiologic calcifications in the basal ganglia bilaterally.  Prominent dural calcifications in the falx cerebri. No acute intracranial abnormalities.  Specifically, no evidence of acute intracranial hemorrhage, no definite findings of acute/subacute cerebral ischemia, no mass, mass effect, hydrocephalus or abnormal intra or extra-axial fluid  collections. Visualized paranasal sinuses and mastoids are well pneumatized, with exception of some mild multifocal mucosal thickening throughout the ethmoid sinuses bilaterally (right greater than left).  No acute displaced skull fractures are identified.  IMPRESSION: 1.  No acute intracranial abnormalities. 2.  The appearance of brain is normal. 3.  Mild paranasal sinus disease without acute features, as above.   Original Report Authenticated By: Trudie Reed, M.D.   Mr Brain Wo Contrast  04/17/2013   *RADIOLOGY REPORT*  Clinical Data: Persistent dizziness.  Diabetes and hypertension. Rule out stroke.  MRI HEAD WITHOUT CONTRAST  Technique:  Multiplanar, multiecho pulse sequences of the brain and surrounding structures were obtained according to standard protocol without intravenous contrast.  Comparison: CT 04/16/2013  Findings: Negative for acute infarct.  Small hyperintensity right frontal Golberg matter appears chronic.  The remaining Mccravy matter and brainstem are normal.  Cerebellum is normal.  Ventricle size is normal.  Negative for hemorrhage or fluid collection.  No mass or edema.  Mucosal thickening in the paranasal sinuses without air-fluid level.  IMPRESSION: No acute abnormality.   Original Report Authenticated By: Janeece Riggers, M.D.    Microbiology: No results found for this or any previous visit (from the past 240 hour(s)).   Labs: Basic Metabolic Panel: No results found for this basename: NA, K, CL, CO2, GLUCOSE, BUN, CREATININE, CALCIUM, MG, PHOS,  in the last 168 hours Liver Function Tests: No results found for this basename: AST, ALT, ALKPHOS, BILITOT, PROT, ALBUMIN,  in the last 168 hours No results found for this basename: LIPASE, AMYLASE,  in the last 168 hours No results found for this basename: AMMONIA,  in the last 168 hours CBC: No results found for this basename: WBC, NEUTROABS, HGB, HCT, MCV, PLT,  in the last 168 hours Cardiac Enzymes: No results found for this  basename: CKTOTAL, CKMB, CKMBINDEX, TROPONINI,  in the last 168 hours BNP: BNP (last 3 results) No results found for this basename: PROBNP,  in the last 8760 hours CBG: No results found for this basename: GLUCAP,  in the last 168 hours     Signed:  Marlin Canary  [facility] 04/25/2013, 12:11 PM

## 2013-11-20 ENCOUNTER — Other Ambulatory Visit: Payer: Self-pay | Admitting: Obstetrics and Gynecology

## 2016-07-06 ENCOUNTER — Emergency Department (HOSPITAL_COMMUNITY)
Admission: EM | Admit: 2016-07-06 | Discharge: 2016-07-06 | Disposition: A | Discharge status: Home / Self Care | Payer: BC Managed Care – PPO | Attending: Emergency Medicine | Admitting: Emergency Medicine

## 2016-07-06 ENCOUNTER — Encounter (HOSPITAL_COMMUNITY): Payer: Self-pay | Admitting: Emergency Medicine

## 2016-07-06 DIAGNOSIS — Z7984 Long term (current) use of oral hypoglycemic drugs: Secondary | ICD-10-CM | POA: Diagnosis not present

## 2016-07-06 DIAGNOSIS — M79604 Pain in right leg: Secondary | ICD-10-CM | POA: Diagnosis not present

## 2016-07-06 DIAGNOSIS — Z79899 Other long term (current) drug therapy: Secondary | ICD-10-CM | POA: Diagnosis not present

## 2016-07-06 DIAGNOSIS — I10 Essential (primary) hypertension: Secondary | ICD-10-CM | POA: Diagnosis not present

## 2016-07-06 DIAGNOSIS — E119 Type 2 diabetes mellitus without complications: Secondary | ICD-10-CM | POA: Insufficient documentation

## 2016-07-06 DIAGNOSIS — M7989 Other specified soft tissue disorders: Secondary | ICD-10-CM | POA: Diagnosis present

## 2016-07-06 LAB — CBC WITH DIFFERENTIAL/PLATELET
BASOS ABS: 0 10*3/uL (ref 0.0–0.1)
Basophils Relative: 0 %
Eosinophils Absolute: 0.4 10*3/uL (ref 0.0–0.7)
Eosinophils Relative: 4 %
HEMATOCRIT: 38.5 % (ref 36.0–46.0)
HEMOGLOBIN: 13.4 g/dL (ref 12.0–15.0)
LYMPHS PCT: 41 %
Lymphs Abs: 4 10*3/uL (ref 0.7–4.0)
MCH: 30 pg (ref 26.0–34.0)
MCHC: 34.8 g/dL (ref 30.0–36.0)
MCV: 86.1 fL (ref 78.0–100.0)
MONO ABS: 0.8 10*3/uL (ref 0.1–1.0)
MONOS PCT: 9 %
NEUTROS ABS: 4.4 10*3/uL (ref 1.7–7.7)
NEUTROS PCT: 46 %
Platelets: 356 10*3/uL (ref 150–400)
RBC: 4.47 MIL/uL (ref 3.87–5.11)
RDW: 14 % (ref 11.5–15.5)
WBC: 9.6 10*3/uL (ref 4.0–10.5)

## 2016-07-06 LAB — BASIC METABOLIC PANEL
ANION GAP: 8 (ref 5–15)
BUN: 26 mg/dL — ABNORMAL HIGH (ref 6–20)
CO2: 24 mmol/L (ref 22–32)
Calcium: 9.8 mg/dL (ref 8.9–10.3)
Chloride: 108 mmol/L (ref 101–111)
Creatinine, Ser: 1.14 mg/dL — ABNORMAL HIGH (ref 0.44–1.00)
GFR calc Af Amer: 58 mL/min — ABNORMAL LOW (ref >60)
GFR, EST NON AFRICAN AMERICAN: 50 mL/min — AB (ref >60)
GLUCOSE: 106 mg/dL — AB (ref 65–99)
POTASSIUM: 3.7 mmol/L (ref 3.5–5.1)
Sodium: 140 mmol/L (ref 135–145)

## 2016-07-06 LAB — D-DIMER, QUANTITATIVE: D-Dimer, Quant: <0.27 ug/mL-FEU (ref 0.00–0.50)

## 2016-07-06 MED ORDER — HYDROCODONE-ACETAMINOPHEN 5-325 MG PO TABS: 1.0000 | ORAL_TABLET | Freq: Four times a day (QID) | ORAL | 0 refills | Status: DC | PRN

## 2016-07-06 MED ORDER — DICLOFENAC SODIUM 1 % TD GEL: 2.0000 g | Freq: Four times a day (QID) | TRANSDERMAL | 0 refills | Status: DC

## 2016-07-06 MED ORDER — IBUPROFEN 200 MG PO TABS
600.0000 mg | ORAL_TABLET | Freq: Once | ORAL | Status: AC
  Administered 2016-07-06: 600 mg via ORAL
  Filled 2016-07-06: qty 3

## 2016-07-06 NOTE — ED Provider Notes (Signed)
[facility]-EMERGENCY DEPT Provider Note   CSN: [id number] Arrival date & time: 07/06/16  1757     History   Chief Complaint Chief Complaint  Patient presents with  . Leg Swelling    right    HPI Diane Jordan is a 65 y.o. female.  The history is provided by the patient. No language interpreter was used.    Diane Jordan is a 65 y.o. female who presents to the Emergency Department complaining of leg pain.  She presents for evaluation of right leg pain and swelling. She is having pain to her right posterior thigh and calf since yesterday. Pain is worse with ambulation and range of motion. No known injuries. No fevers, chest pain, shortness of breath. She was referred by her PCP for rule out DVT.  Past Medical History:  Diagnosis Date  . Arthritis   . DM (diabetes mellitus) (HCC)   . HTN (hypertension)   . Postmenopausal     Patient Active Problem List   Diagnosis Date Noted  . Type 2 diabetes mellitus (HCC) 04/17/2013  . Hypertension 04/17/2013  . Vertigo 04/17/2013    Past Surgical History:  Procedure Laterality Date  . TUBAL LIGATION     1982    OB History    No data available       Home Medications    Prior to Admission medications   Medication Sig Start Date End Date Taking? Authorizing Provider  fexofenadine (ALLEGRA) 30 MG tablet Take 30 mg by mouth daily.   Yes Historical Provider, MD  losartan (COZAAR) 100 MG tablet Take 100 mg by mouth daily.  07/04/16  Yes Historical Provider, MD  metFORMIN (GLUCOPHAGE) 500 MG tablet Take 500 mg by mouth 2 (two) times daily with a meal.   Yes Historical Provider, MD  Multiple Vitamin (MULTIVITAMIN WITH MINERALS) TABS Take 1 tablet by mouth daily.   Yes Historical Provider, MD  omega-3 acid ethyl esters (LOVAZA) 1 G capsule Take 1 g by mouth daily.    Yes Historical Provider, MD  diclofenac sodium (VOLTAREN) 1 % GEL Apply 2 g topically 4 (four) times daily. 07/06/16   Tilden Fossa, MD  HYDROcodone-acetaminophen  (NORCO/VICODIN) 5-325 MG tablet Take 1 tablet by mouth every 6 (six) hours as needed. 07/06/16   Tilden Fossa, MD    Family History No family history on file.  Social History Social History  Substance Use Topics  . Smoking status: Never Smoker  . Smokeless tobacco: Never Used  . Alcohol use No     Allergies   Fluticasone propionate and Prinivil [lisinopril]   Review of Systems Review of Systems  All other systems reviewed and are negative.    Physical Exam Updated Vital Signs BP 161/91 (BP Location: Left Arm)   Pulse 63   Temp 97.7 F (36.5 C) (Oral)   Resp 20   Ht 5\' 5"  (1.651 m)   Wt 256 lb (116.1 kg)   SpO2 98%   BMI 42.60 kg/m   Physical Exam  Constitutional: She is oriented to person, place, and time. She appears well-developed and well-nourished.  HENT:  Head: Normocephalic and atraumatic.  Cardiovascular: Normal rate and regular rhythm.   No murmur heard. Pulmonary/Chest: Effort normal and breath sounds normal. No respiratory distress.  Abdominal: Soft. There is no tenderness. There is no rebound and no guarding.  Musculoskeletal:  2+ DP pulses bilaterally. There is tenderness to the right posterior calf and right posterior thigh without any significant erythema or induration. Antalgic  gait.  Neurological: She is alert and oriented to person, place, and time.  Skin: Skin is warm and dry.  Psychiatric: She has a normal mood and affect. Her behavior is normal.  Nursing note and vitals reviewed.    ED Treatments / Results  Labs (all labs ordered are listed, but only abnormal results are displayed) Labs Reviewed  BASIC METABOLIC PANEL - Abnormal; Notable for the following:       Result Value   Glucose, Bld 106 (*)    BUN 26 (*)    Creatinine, Ser 1.14 (*)    GFR calc non Af Amer 50 (*)    GFR calc Af Amer 58 (*)    All other components within normal limits  D-DIMER, QUANTITATIVE (NOT AT [facility])  CBC WITH DIFFERENTIAL/PLATELET    EKG  EKG  Interpretation None       Radiology No results found.  Procedures Procedures (including critical care time)  Medications Ordered in ED Medications  ibuprofen (ADVIL,MOTRIN) tablet 600 mg (600 mg Oral Given 07/06/16 2048)     Initial Impression / Assessment and Plan / ED Course  I have reviewed the triage vital signs and the nursing notes.  Pertinent labs & imaging results that were available during my care of the patient were reviewed by me and considered in my medical decision making (see chart for details).  Clinical Course    Patient referred by PCP for evaluation of right leg swelling and pain, concern for DVT. Vascular ultrasound not available at this time. D-dimer is negative, presentation is not consistent with DVT. She is well perfused on examination with no evidence of infection.  Discussed with patient musculoskeletal leg pain. Will treat with pain medication, Voltaren gel. Discussed outpatient follow-up and return precautions.  Final Clinical Impressions(s) / ED Diagnoses   Final diagnoses:  Right leg pain    New Prescriptions Discharge Medication List as of 07/06/2016 10:08 PM    START taking these medications   Details  diclofenac sodium (VOLTAREN) 1 % GEL Apply 2 g topically 4 (four) times daily., Starting Tue 07/06/2016, Print    HYDROcodone-acetaminophen (NORCO/VICODIN) 5-325 MG tablet Take 1 tablet by mouth every 6 (six) hours as needed., Starting Tue 07/06/2016, Print         Tilden FossaElizabeth Ciel Chervenak, MD 07/07/16 [phone]

## 2016-07-06 NOTE — ED Notes (Signed)
PT DISCHARGED. INSTRUCTIONS AND PRESCRIPTIONS GIVEN. AAOX4. PT IN NO APPARENT DISTRESS. THE OPPORTUNITY TO ASK QUESTIONS WAS PROVIDED. 

## 2016-07-06 NOTE — ED Notes (Signed)
RN sts she will do blood draw with IV placement.

## 2016-07-06 NOTE — ED Triage Notes (Signed)
Patient was seen at her PCP doctor today for pain/swelling in the right lower leg. Pt denies recent travel or any new medications. Denies shortness of breath. Patient was advised to come to the ER to rule out DVT. Denies blood thinner use, besides a daily baby aspirin

## 2016-07-29 ENCOUNTER — Other Ambulatory Visit: Payer: Self-pay | Admitting: Orthopedic Surgery

## 2016-07-29 DIAGNOSIS — M5441 Lumbago with sciatica, right side: Secondary | ICD-10-CM

## 2016-08-08 ENCOUNTER — Ambulatory Visit
Admission: RE | Admit: 2016-08-08 | Discharge: 2016-08-08 | Disposition: A | Discharge status: Home / Self Care | Payer: BC Managed Care – PPO | Source: Ambulatory Visit | Attending: Orthopedic Surgery | Admitting: Orthopedic Surgery

## 2016-08-08 DIAGNOSIS — M5441 Lumbago with sciatica, right side: Secondary | ICD-10-CM

## 2016-08-09 ENCOUNTER — Other Ambulatory Visit: Payer: Self-pay | Admitting: Orthopedic Surgery

## 2016-08-09 DIAGNOSIS — M5441 Lumbago with sciatica, right side: Secondary | ICD-10-CM

## 2016-08-21 ENCOUNTER — Ambulatory Visit
Admission: RE | Admit: 2016-08-21 | Discharge: 2016-08-21 | Disposition: A | Discharge status: Home / Self Care | Payer: BC Managed Care – PPO | Source: Ambulatory Visit | Attending: Orthopedic Surgery | Admitting: Orthopedic Surgery

## 2016-08-21 DIAGNOSIS — M5441 Lumbago with sciatica, right side: Secondary | ICD-10-CM

## 2020-08-16 ENCOUNTER — Ambulatory Visit: Payer: BC Managed Care – PPO | Attending: Internal Medicine

## 2020-08-16 DIAGNOSIS — Z23 Encounter for immunization: Secondary | ICD-10-CM

## 2020-08-16 NOTE — Progress Notes (Signed)
   Covid-19 Vaccination Clinic  Name:  Diane Jordan    MRN: [id number] DOB: Oct 15, 1951  08/16/2020  [name] was observed post Covid-19 immunization for 15 minutes without incident. She was provided with Vaccine Information Sheet and instruction to access the V-Safe system.   [name] was instructed to call 911 with any severe reactions post vaccine: Marland Kitchen Difficulty breathing  . Swelling of face and throat  . A fast heartbeat  . A bad rash all over body  . Dizziness and weakness

## 2020-12-11 ENCOUNTER — Ambulatory Visit (INDEPENDENT_AMBULATORY_CARE_PROVIDER_SITE_OTHER): Payer: BC Managed Care – PPO | Admitting: Neurology

## 2020-12-11 ENCOUNTER — Encounter: Payer: Self-pay | Admitting: Neurology

## 2020-12-11 VITALS — BP 126/72 | HR 82 | Ht 65.0 in | Wt 245.0 lb

## 2020-12-11 DIAGNOSIS — R251 Tremor, unspecified: Secondary | ICD-10-CM

## 2020-12-11 DIAGNOSIS — R519 Headache, unspecified: Secondary | ICD-10-CM

## 2020-12-11 DIAGNOSIS — G479 Sleep disorder, unspecified: Secondary | ICD-10-CM | POA: Diagnosis not present

## 2020-12-11 DIAGNOSIS — R0683 Snoring: Secondary | ICD-10-CM | POA: Diagnosis not present

## 2020-12-11 DIAGNOSIS — R351 Nocturia: Secondary | ICD-10-CM

## 2020-12-11 NOTE — Progress Notes (Signed)
Subjective:    Patient ID: Diane Jordan is a 70 y.o. female.  HPI     Huston Foley, MD, PhD [facility] [address], [address] [address] [place], [place] [address]  Dear Dr. Talmage Nap,   I saw your patient, Diane Jordan, upon your kind request in my neurologic clinic today for initial consultation of her tremors.  Patient is unaccompanied today.  As you know, [name] is a 70 year old right-handed woman with an underlying medical history of hypertension, diabetes, hyperlipidemia, arthritis, allergies, and severe obesity with a BMI of over 40, who reports a history of hand tremors for maybe 3 years.  They are intermittent, not very bothersome, mostly in both hands and noticeable with handwriting or when she points towards something.  Generally speaking, she is not impaired in her day-to-day functioning.  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  She does not drink any caffeine.  She initially denied any family history of tremors but upon further asking, she does report a hand tremor in her sister who has dementia.  She has no family history of Parkinson's disease.  She has 3 grown children, none with tremor issues.  She lives with her husband.  Of note, she does not always sleep very well.  She has nocturia once per night, she occasionally wakes up with a headache and snoring has been noted by her husband who has encouraged her to get checked out for this.  He has a CPAP machine so she is familiar with the diagnosis of obstructive sleep apnea.  I reviewed your office note from 10/15/2020.  She had blood work on 10/08/2020, BUN was 23, glucose 86, creatinine elevated at 1.3, cholesterol 206, HDL 70, LDL 113, triglycerides 115, A1c on 10/15/2020 was 6.1.  She also had a vitamin B12 level and vitamin D level checked which reportedly were normal on 10/15/2020.  B12 was 541, vitamin D 37.8.   She tries to hydrate well with water.  She denies any other neurological  symptoms.  Of note, she had a brain MRI without contrast on 04/17/2013 and I reviewed the results:  IMPRESSION:  No acute abnormality.   Her Past Medical History Is Significant For: Past Medical History:  Diagnosis Date  . Arthritis   . DM (diabetes mellitus) (HCC)   . HTN (hypertension)   . Postmenopausal     Her Past Surgical History Is Significant For: Past Surgical History:  Procedure Laterality Date  . TUBAL LIGATION     1982    Her Family History Is Significant For: Family History  Problem Relation Age of Onset  . Pulmonary fibrosis Mother   . Heart disease Mother   . High blood pressure Father   . Diabetes Father     Her Social History Is Significant For: Social History   Socioeconomic History  . Marital status: Married    Spouse name: Not on file  . Number of children: 3  . Years of education: Not on file  . Highest education level: Not on file  Occupational History    Employer: [employer]  Tobacco Use  . Smoking status: Never Smoker  . Smokeless tobacco: Never Used  Substance and Sexual Activity  . Alcohol use: No  . Drug use: No  . Sexual activity: Yes    Partners: Male    Birth control/protection: Pill, Surgical, Post-menopausal  Other Topics Concern  . Not on file  Social History Narrative  . Not on  file   Social Determinants of Health   Financial Resource Strain: Not on file  Food Insecurity: Not on file  Transportation Needs: Not on file  Physical Activity: Not on file  Stress: Not on file  Social Connections: Not on file    Her Allergies Are:  Allergies  Allergen Reactions  . Capsules, Empty Gelatin [Gelatin]     Any capsule form med causes itching.   . Fluticasone Propionate Itching  . Prinivil [Lisinopril] Itching  :   Her Current Medications Are:  Outpatient Encounter Medications as of 12/11/2020  Medication Sig  . aspirin 81 MG EC tablet 1 tablet  . famotidine (PEPCID) 20 MG tablet famotidine 20 mg tablet  TAKE 1  TABLET BY MOUTH TWICE A DAY  . fexofenadine (ALLEGRA) 30 MG tablet Take 30 mg by mouth daily.  . hydrOXYzine (ATARAX/VISTARIL) 25 MG tablet Take 25 mg by mouth as needed.  . insulin aspart protamine - aspart (NOVOLOG MIX 70/30 FLEXPEN) (70-30) 100 UNIT/ML FlexPen 40am/35upm  . metFORMIN (GLUCOPHAGE) 500 MG tablet Take 500 mg by mouth 2 (two) times daily with a meal.  . Multiple Vitamin (MULTIVITAMIN WITH MINERALS) TABS Take 1 tablet by mouth daily.  . [DISCONTINUED] diclofenac sodium (VOLTAREN) 1 % GEL Apply 2 g topically 4 (four) times daily.  . [DISCONTINUED] HYDROcodone-acetaminophen (NORCO/VICODIN) 5-325 MG tablet Take 1 tablet by mouth every 6 (six) hours as needed.  . [DISCONTINUED] losartan (COZAAR) 100 MG tablet Take 100 mg by mouth daily.   . [DISCONTINUED] omega-3 acid ethyl esters (LOVAZA) 1 G capsule Take 1 g by mouth daily.    No facility-administered encounter medications on file as of 12/11/2020.  :   Review of Systems:  Out of a complete 14 point review of systems, all are reviewed and negative with the exception of these symptoms as listed below:  Review of Systems  Neurological:       Here to discuss worsening bilateral hand tremors. Pt reports tremors are more pronounced when she is trying to complete a task. Pt reports sx have been present for a while. She is right hand dominate.     Objective:  Neurological Exam  Physical Exam Physical Examination:   Vitals:   12/11/20 0751  BP: 126/72  Pulse: 82  SpO2: 96%    General Examination: The patient is a very pleasant 70 y.o. female in no acute distress. She appears well-developed and well-nourished and well groomed.   HEENT: Normocephalic, atraumatic, pupils are equal, round and reactive to light and accommodation. Extraocular tracking is good without limitation to gaze excursion or nystagmus noted. Normal smooth pursuit is noted. Hearing is grossly intact. Face is symmetric with normal facial animation and normal  facial sensation. Speech is clear with no dysarthria noted. There is no hypophonia. There is no lip, neck/head, jaw or voice tremor. Neck is supple with full range of passive and active motion. There are no carotid bruits on auscultation. Oropharynx exam reveals: mild mouth dryness, adequate dental hygiene and moderate airway crowding. Tongue protrudes centrally and palate elevates symmetrically.  Neck circumference is 17-1/4 inches.    Chest: Clear to auscultation without wheezing, rhonchi or crackles noted.  Heart: S1+S2+0, regular and normal without murmurs, rubs or gallops noted.   Abdomen: Soft, non-tender and non-distended with normal bowel sounds appreciated on auscultation.  Extremities: There is no pitting edema in the distal lower extremities bilaterally. Pedal pulses are intact.  Skin: Warm and dry without trophic changes noted.  Musculoskeletal: exam reveals  no obvious joint deformities, tenderness or joint swelling or erythema.   Neurologically:  Mental status: The patient is awake, alert and oriented in all 4 spheres. Her immediate and remote memory, attention, language skills and fund of knowledge are appropriate. There is no evidence of aphasia, agnosia, apraxia or anomia. Speech is clear with normal prosody and enunciation. Thought process is linear. Mood is normal and affect is normal.  Cranial nerves II - XII are as described above under HEENT exam. In addition: shoulder shrug is normal with equal shoulder height noted. Motor exam: Normal bulk, strength and tone is noted. There is no drift, resting tremor or rebound.   On 12/11/2020: On Archimedes spiral drawing she has slight trembling with the left and right hand, handwriting with the right hand is slightly tremulous, legible, not micrographic.   She does not have a significant postural or action tremor, slight intention tremor noted with both upper extremities.  No lower extremity tremor noted. Romberg is negative. Reflexes  are 2+ throughout. Babinski: Toes are flexor bilaterally. Fine motor skills and coordination: intact with normal finger taps, normal hand movements, normal rapid alternating patting, normal foot taps and normal foot agility.  Cerebellar testing: No dysmetria or intention tremor on finger to nose testing. Heel to shin is unremarkable bilaterally. There is no truncal or gait ataxia.  Sensory exam: intact to light touch in the upper and lower extremities.  Gait, station and balance: She stands easily. No veering to one side is noted. No leaning to one side is noted. Posture is age-appropriate and stance is narrow based. Gait shows normal stride length and normal pace. No problems turning are noted.   Assessment and Plan:   In summary, KHARISMA GLASNER is a very pleasant 71 y.o.-year old female with an underlying medical history of hypertension, diabetes, hyperlipidemia, arthritis, allergies, and severe obesity with a BMI of over 40, who presents for evaluation of her tremor disorder. Her history and examination are not telltale for essential tremor but she reports having a sister with tremors and she may have a very mild or beginning form of essential tremor. Certainly, she does not have any evidence of parkinsonism. She is reassured in that regard. We talked about common tremor triggers. These include dehydration, blood sugar fluctuation, anxiety, stress, sleep deprivation or poor sleep quality, thyroid dysfunction. Since she reports snoring and does not wake up fully rested, she is advised to proceed with a sleep study. If she has underlying obstructive sleep apnea, she would likely benefit from treatment. If she has moderate to severe obstructive sleep apnea, she may be at risk for for long-term cardiovascular complications and we talked about this as well. If she has mild sleep apnea, she may want to try treatment in the form of an AutoPap machine to see if she feels better and if the tremor improves as a  secondary result, it would be very welcome. For now, I did not suggest any new medications for symptomatic tremor control and we can monitor her symptoms and findings. We will plan to follow-up after testing. We will do a TSH checked today and call her with the results.  I answered all her questions today and she was in agreement with the plan.   Thank you very much for allowing me to participate in the care of this nice patient. If I can be of any further assistance to you please do not hesitate to call me at [phone].  Sincerely,   Huston Foley, MD,  PhD

## 2020-12-11 NOTE — Patient Instructions (Signed)
You have a rather mild and intermittent tremor of both hands.  I do not see any signs or symptoms of parkinson's like disease or what we call parkinsonism.   For your tremor, I would not recommend any new medication, as your symptoms and presentation are mild but we can certainly monitor your tremor.   Please remember, that any kind of tremor may be exacerbated by anxiety, anger, nervousness, excitement, dehydration, sleep deprivation, by caffeine, and low blood sugar values or blood sugar fluctuations and thyroid disease.  You had recent blood work but I did not see a TSH, which is a thyroid screening test.  Some medications can exacerbate tremors, which includes antidepressant medications.  You are really not on anything that would bring out a tremor.  As discussed, sometimes poor sleep quality and sleep deprivation can cause tremors.  Since you snore and have a crowded appearing airway and are overweight, I would like to suggest we proceed with a sleep study.  You are familiar with a diagnosis of sleep apnea as her husband has a CPAP machine.  Once we do the testing, we will call you with the results and consider treatment with a CPAP or AutoPap machine.  We will keep you posted.  We will plan to follow-up after your test.

## 2020-12-12 LAB — TSH: TSH: 2.77 u[IU]/mL (ref 0.450–4.500)

## 2020-12-15 ENCOUNTER — Telehealth: Payer: Self-pay

## 2020-12-15 NOTE — Telephone Encounter (Signed)
-----   Message from Huston Foley, MD sent at 12/15/2020 12:43 PM EST ----- Thyroid screening test called TSH was unremarkable, please update patient.

## 2020-12-15 NOTE — Progress Notes (Signed)
Thyroid screening test called TSH was unremarkable, please update patient.

## 2020-12-15 NOTE — Telephone Encounter (Signed)
lvm advising of results (ok per dpr) Pt was advised to call back if she had any questions/concerns.

## 2021-01-02 ENCOUNTER — Other Ambulatory Visit: Payer: Self-pay

## 2021-01-02 ENCOUNTER — Ambulatory Visit (INDEPENDENT_AMBULATORY_CARE_PROVIDER_SITE_OTHER): Payer: BC Managed Care – PPO | Admitting: Neurology

## 2021-01-02 DIAGNOSIS — R519 Headache, unspecified: Secondary | ICD-10-CM

## 2021-01-02 DIAGNOSIS — R251 Tremor, unspecified: Secondary | ICD-10-CM

## 2021-01-02 DIAGNOSIS — G4720 Circadian rhythm sleep disorder, unspecified type: Secondary | ICD-10-CM

## 2021-01-02 DIAGNOSIS — R351 Nocturia: Secondary | ICD-10-CM

## 2021-01-02 DIAGNOSIS — G4733 Obstructive sleep apnea (adult) (pediatric): Secondary | ICD-10-CM

## 2021-01-02 DIAGNOSIS — G479 Sleep disorder, unspecified: Secondary | ICD-10-CM

## 2021-01-02 DIAGNOSIS — R0683 Snoring: Secondary | ICD-10-CM

## 2021-01-14 NOTE — Progress Notes (Signed)
Patient referred by Dr. Talmage Nap, seen by me on 12/11/20, diagnostic PSG on 01/02/21.   Please call and notify the patient that the recent sleep study showed moderate to severe obstructive sleep apnea. I recommend treatment for this in the form of CPAP. This will require a repeat sleep study for proper titration and mask fitting and correct monitoring of the oxygen saturations. Please explain to patient. I have placed an order in the chart. Thanks.  Huston Foley, MD, PhD [facility])

## 2021-01-14 NOTE — Addendum Note (Signed)
Addended by: Huston Foley on: 01/14/2021 05:26 PM   Modules accepted: Orders

## 2021-01-14 NOTE — Procedures (Signed)
nPATIENT'S NAME:  Diane Jordan, Diane Jordan DOB:      11/16/1950      MR#:    [id number]     DATE OF RECORDING: 01/02/2021 REFERRING M.D.:  Dorisann Frames, MD Study Performed:   Baseline Polysomnogram HISTORY: 70 year old woman with a history of hypertension, diabetes, hyperlipidemia, arthritis, allergies, and severe obesity with a BMI of over 40, who does not sleep well.  She has nocturia once per night, she occasionally wakes up with a headache and snoring has been noted by her husband who has encouraged her to get checked out for this. The patient's weight 245 pounds with a height of 5'5" (inches), resulting in a BMI of 40 kg/m2. The patient's neck circumference measured 17 inches.  CURRENT MEDICATIONS: Aspirin, Pepcid, Allegra, Atarax, Novolog, Metformin, Multi-vit   PROCEDURE:  This is a multichannel digital polysomnogram utilizing the Somnostar 11.2 system.  Electrodes and sensors were applied and monitored per AASM Specifications.   EEG, EOG, Chin and Limb EMG, were sampled at 200 Hz.  ECG, Snore and Nasal Pressure, Thermal Airflow, Respiratory Effort, CPAP Flow and Pressure, Oximetry was sampled at 50 Hz. Digital video and audio were recorded.      BASELINE STUDY  Lights Out was at 21:39 and Lights On at 05:11.  Total recording time (TRT) was 452 minutes, with a total sleep time (TST) of 318.5 minutes.   The patient's sleep latency was 68.5 minutes, which is delayed.  REM latency was 108.5 minutes, which is normal. The sleep efficiency was 70.5%.     SLEEP ARCHITECTURE: WASO (Wake after sleep onset) was 112.5 minutes with mild to moderate sleep fragmentation noted. There were 44.5 minutes in Stage N1, 196.5 minutes Stage N2, 51.5 minutes Stage N3 and 26 minutes in Stage REM.  The percentage of Stage N1 was 14.%, which is increased, Stage N2 was 61.7%, which is increased, Stage N3 was 16.2% and Stage R (REM sleep) was 8.2%, which is reduced. The arousals were noted as: 56 were spontaneous, 0 were associated  with PLMs, 20 were associated with respiratory events.  RESPIRATORY ANALYSIS:  There were a total of 136 respiratory events:  0 obstructive apneas, 0 central apneas and 0 mixed apneas with a total of 0 apneas and an apnea index (AI) of 0 /hour. There were 136 hypopneas with a hypopnea index of 25.6 /hour. The patient also had 0 respiratory event related arousals (RERAs).      The total APNEA/HYPOPNEA INDEX (AHI) was 25.6/hour and the total RESPIRATORY DISTURBANCE INDEX was  25.6 /hour.  13 events occurred in REM sleep and 246 events in NREM. The REM AHI was  30 /hour, versus a non-REM AHI of 25.2. The patient spent 1 minutes of total sleep time in the supine position and 318 minutes in non-supine. The supine AHI was n/a versus a non-supine AHI of 25.7.  OXYGEN SATURATION & C02:  The Wake baseline 02 saturation was 95%, with the lowest being 80%. Time spent below 89% saturation equaled 6 minutes.  PERIODIC LIMB MOVEMENTS: The patient had a total of 0 Periodic Limb Movements.  The Periodic Limb Movement (PLM) index was 0 and the PLM Arousal index was 0/hour.  Audio and video analysis did not show any abnormal or unusual movements, behaviors, phonations or vocalizations. The patient took 1 bathroom break. Mild snoring was noted. The EKG was in keeping with normal sinus rhythm (NSR).  Post-study, the patient indicated that sleep was the same as usual.   IMPRESSION:  1. Obstructive  Sleep Apnea (OSA) 2. Dysfunctions associated with sleep stages or arousal from sleep  RECOMMENDATIONS:  1. This study demonstrates moderate to severe obstructive sleep apnea, with a total AHI of 25.6/hour, REM AHI of 30/hour, and O2 nadir of 80%.  The absence of supine sleep likely underestimates her sleep disordered breathing.  Treatment with positive airway pressure in the form of CPAP is recommended. This will require a full night titration study to optimize therapy. Other treatment options may include avoidance of  supine sleep position along with weight loss, upper airway or jaw surgery in selected patients or the use of an oral appliance in certain patients. ENT evaluation and/or consultation with a maxillofacial surgeon or dentist may be feasible in some instances.    2. Please note that untreated obstructive sleep apnea may carry additional perioperative morbidity. Patients with significant obstructive sleep apnea should receive perioperative PAP therapy and the surgeons and particularly the anesthesiologist should be informed of the diagnosis and the severity of the sleep disordered breathing. 3. This study shows sleep fragmentation and abnormal sleep stage percentages; these are nonspecific findings and per se do not signify an intrinsic sleep disorder or a cause for the patient's sleep-related symptoms. Causes include (but are not limited to) the first night effect of the sleep study, circadian rhythm disturbances, medication effect or an underlying mood disorder or medical problem.  4. The patient should be cautioned not to drive, work at heights, or operate dangerous or heavy equipment when tired or sleepy. Review and reiteration of good sleep hygiene measures should be pursued with any patient. 5. The patient will be seen in follow-up in the sleep clinic at [facility] for discussion of the test results, symptom and treatment compliance review, further management strategies, etc. The referring provider will be notified of the test results.  I certify that I have reviewed the entire raw data recording prior to the issuance of this report in accordance with the Standards of Accreditation of the American Academy of Sleep Medicine (AASM)   Huston Foley, MD, PhD Diplomat, American Board of Neurology and Sleep Medicine (Neurology and Sleep Medicine)

## 2021-01-15 ENCOUNTER — Telehealth: Payer: Self-pay

## 2021-01-15 NOTE — Telephone Encounter (Signed)
I called pt. No answer, left a message asking pt to call me back.   

## 2021-01-15 NOTE — Telephone Encounter (Signed)
-----   Message from Huston Foley, MD sent at 01/14/2021  5:23 PM EDT ----- Patient referred by Dr. Talmage Nap, seen by me on 12/11/20, diagnostic PSG on 01/02/21.   Please call and notify the patient that the recent sleep study showed moderate to severe obstructive sleep apnea. I recommend treatment for this in the form of CPAP. This will require a repeat sleep study for proper titration and mask fitting and correct monitoring of the oxygen saturations. Please explain to patient. I have placed an order in the chart. Thanks.  Huston Foley, MD, PhD [facility])

## 2021-01-20 NOTE — Telephone Encounter (Signed)
I called pt. I advised pt that Dr. Frances Furbish reviewed their sleep study results and found that pt has moderate to sever osa and recommends that pt be treated with a cpap. Dr. Frances Furbish recommends that pt return for a repeat sleep study in order to properly titrate the cpap and ensure a good mask fit. Pt is agreeable to returning for a titration study. I advised pt that our sleep lab will file with pt's insurance and call pt to schedule the sleep study when we hear back from the pt's insurance regarding coverage of this sleep study. Pt verbalized understanding of results. Pt had no questions at this time but was encouraged to call back if questions arise.

## 2021-02-13 ENCOUNTER — Other Ambulatory Visit: Payer: Self-pay

## 2021-02-13 ENCOUNTER — Ambulatory Visit (INDEPENDENT_AMBULATORY_CARE_PROVIDER_SITE_OTHER): Payer: BC Managed Care – PPO | Admitting: Neurology

## 2021-02-13 DIAGNOSIS — G4733 Obstructive sleep apnea (adult) (pediatric): Secondary | ICD-10-CM

## 2021-02-13 DIAGNOSIS — R519 Headache, unspecified: Secondary | ICD-10-CM

## 2021-02-13 DIAGNOSIS — R351 Nocturia: Secondary | ICD-10-CM

## 2021-02-13 DIAGNOSIS — R251 Tremor, unspecified: Secondary | ICD-10-CM

## 2021-02-13 DIAGNOSIS — G4720 Circadian rhythm sleep disorder, unspecified type: Secondary | ICD-10-CM

## 2021-02-13 DIAGNOSIS — G4739 Other sleep apnea: Secondary | ICD-10-CM

## 2021-02-13 DIAGNOSIS — G479 Sleep disorder, unspecified: Secondary | ICD-10-CM

## 2021-02-24 NOTE — Procedures (Signed)
PATIENT'S NAME:  Diane Jordan, Torry DOB:      Jan 16, 1951      MR#:    [id number]     DATE OF RECORDING: 02/13/2021 REFERRING M.D.:  Dorisann Frames, MD Study Performed:   CPAP  Titration HISTORY: 70 year old woman with a history of hypertension, diabetes, hyperlipidemia, arthritis, allergies, and severe obesity with a BMI of over 40, who presents for a full night titration study to treat her sleep apnea. Her baseline sleep study from 01/02/21 showed moderate to severe obstructive sleep apnea, with a total AHI of 25.6/hour, REM AHI of 30/hour, and O2 nadir of 80%.    CURRENT MEDICATIONS: Aspirin, Pepcid, Allegra, Atarax, Novolog, Metformin, Multi-vit  PROCEDURE:  This is a multichannel digital polysomnogram utilizing the SomnoStar 11.2 system.  Electrodes and sensors were applied and monitored per AASM Specifications.   EEG, EOG, Chin and Limb EMG, were sampled at 200 Hz.  ECG, Snore and Nasal Pressure, Thermal Airflow, Respiratory Effort, CPAP Flow and Pressure, Oximetry was sampled at 50 Hz. Digital video and audio were recorded.      The patient was fitted with a large piece onto nasal mask.  She was started on CPAP of 5 cm with heated humidity and pressure was gradually increased to a final pressure of 10 cm.  She had significant increase in central apneas after blood pressure was increased above 7 cm.  On a pressure of 7 cm she achieved 68 minutes of sleep, AHI was 8.8/h with no central apneas noted, O2 nadir 91% with brief supine REM sleep achieved.  Lights Out was at 22:03 and Lights On at 05:00. Total recording time (TRT) was 417.5 minutes, with a total sleep time (TST) of 388 minutes. The patient's sleep latency was 25.5 minutes. REM latency was 67.5 minutes, which is mildly reduced. The sleep efficiency was 92.9 %.    SLEEP ARCHITECTURE: WASO (Wake after sleep onset)  was 14 minutes with minimal sleep fragmentation noted. There were 12 minutes in Stage N1, 256.5 minutes Stage N2, 76.5 minutes Stage  N3 and 43 minutes in Stage REM.  The percentage of Stage N1 was 3.1%, Stage N2 was 66.1%, which is increased, Stage N3 was 19.7%, which is normal and Stage R (REM sleep) was 11.1%, which is reduced. The arousals were noted as: 29 were spontaneous, 1 were associated with PLMs, 8 were associated with respiratory events.  RESPIRATORY ANALYSIS:  There was a total of 106 respiratory events: 1 obstructive apneas, 87 central apneas and 2 mixed apneas with a total of 90 apneas and an apnea index (AI) of 13.9 /hour. There were 16 hypopneas with a hypopnea index of 2.5/hour. The patient also had 0 respiratory event related arousals (RERAs).      The total APNEA/HYPOPNEA INDEX  (AHI) was 16.4 /hour and the total RESPIRATORY DISTURBANCE INDEX was 16.4 /hour  5 events occurred in REM sleep and 101 events in NREM. The REM AHI was 7. /hour versus a non-REM AHI of 17.6 /hour.  The patient spent 46 minutes of total sleep time in the supine position and 342 minutes in non-supine. The supine AHI was 11.7, versus a non-supine AHI of 17.0.  OXYGEN SATURATION & C02:  The baseline 02 saturation was 94%, with the lowest being 88%. Time spent below 89% saturation equaled 0 minutes.  PERIODIC LIMB MOVEMENTS:  The patient had a total of 5 Periodic Limb Movements. The Periodic Limb Movement (PLM) index was .8 and the PLM Arousal index was .2 /hour.  Audio  and video analysis did not show any abnormal or unusual movements, behaviors, phonations or vocalizations. The patient took no bathroom breaks. The EKG was in keeping with normal sinus rhythm (NSR).  Post-study, the patient indicated that sleep was the same as usual.   IMPRESSION: 1. Obstructive Sleep Apnea (OSA) 2. Treatment-emergent central apneas 3. Dysfunctions associated with sleep stages or arousal from sleep   RECOMMENDATIONS: 1.  This study demonstrates significant improvement in the patient's obstructive sleep apnea with CPAP therapy, as well as evidence of  significant treatment emergent central apneas as CPAP was increased.  Her central apneas may improve over time.  I will recommend home CPAP therapy at a pressure of 7 cm for now.  This may result in slightly suboptimal treatment of her sleep apnea, especially her REM related sleep apnea.  With time, if her central respiratory events improve, a cautious increase in her treatment pressure can be considered and ultimately, BiPAP therapy can be considered to optimize treatment, if the need arises.  The patient will be advised to be fully compliant with PAP therapy to improve sleep related symptoms and decrease long term cardiovascular risks. The patient should be reminded, that it may take up to 3 months to get fully used to using PAP with all planned sleep. The earlier full compliance is achieved, the better long term compliance tends to be. Please note that untreated obstructive sleep apnea may carry additional perioperative morbidity. Patients with significant obstructive sleep apnea should receive perioperative PAP therapy and the surgeons and particularly the anesthesiologist should be informed of the diagnosis and the severity of the sleep disordered breathing. 2. This study shows sleep fragmentation and abnormal sleep stage percentages; these are nonspecific findings and per se do not signify an intrinsic sleep disorder or a cause for the patient's sleep-related symptoms. Causes include (but are not limited to) the first night effect of the sleep study, circadian rhythm disturbances, medication effect or an underlying mood disorder or medical problem.  3. The patient should be cautioned not to drive, work at heights, or operate dangerous or heavy equipment when tired or sleepy. Review and reiteration of good sleep hygiene measures should be pursued with any patient. 4. The patient will be seen in follow-up in the sleep clinic at [facility] for discussion of the test results, symptom and treatment compliance review,  further management strategies, etc. The referring provider will be notified of the test results.   I certify that I have reviewed the entire raw data recording prior to the issuance of this report in accordance with the Standards of Accreditation of the American Academy of Sleep Medicine (AASM)     Huston Foley, MD, PhD Diplomat, American Board of Neurology and Sleep Medicine (Neurology and Sleep Medicine)

## 2021-02-24 NOTE — Addendum Note (Signed)
Addended by: Huston Foley on: 02/24/2021 05:56 PM   Modules accepted: Orders

## 2021-02-24 NOTE — Progress Notes (Signed)
Patient referred by Dr. Talmage Nap, seen by me on 12/11/20, diagnostic PSG on 01/02/21. Patient had a CPAP titration study on 02/13/21.  Please call and inform patient that I have entered an order for treatment with positive airway pressure (PAP) treatment for obstructive sleep apnea (OSA). She did fairly well during the latest sleep study with CPAP. We will, therefore, arrange for a machine for home use through a DME (durable medical equipment) company of Her choice; and I will see the patient back in follow-up in about 10 weeks. Please also explain to the patient that I will be looking out for compliance data, which can be downloaded from the machine (stored on an SD card, that is inserted in the machine) or via remote access through a modem, that is built into the machine. At the time of the followup appointment we will discuss sleep study results and how it is going with PAP treatment at home. Please advise patient to bring Her machine at the time of the first FU visit, even though this is cumbersome. Bringing the machine for every visit after that will likely not be needed, but often helps for the first visit to troubleshoot if needed. Please re-enforce the importance of compliance with treatment and the need for [place] to monitor compliance data - often an insurance requirement and actually good feedback for the patient as far as how they are doing.  Also remind patient, that any interim PAP machine or mask issues should be first addressed with the DME company, as they can often help better with technical and mask fit issues. Please ask if patient has a preference regarding DME company.  Please also make sure, the patient has a follow-up appointment with me in about 10 weeks from the setup date, thanks. May see one of our nurse practitioners if needed for proper timing of the FU appointment.  Please fax or rout report to the referring provider. Thanks,   Huston Foley, MD, PhD [facility])

## 2021-02-25 ENCOUNTER — Telehealth: Payer: Self-pay

## 2021-02-25 NOTE — Telephone Encounter (Signed)
-----   Message from Huston Foley, MD sent at 02/24/2021  5:56 PM EDT ----- Patient referred by Dr. Talmage Nap, seen by me on 12/11/20, diagnostic PSG on 01/02/21. Patient had a CPAP titration study on 02/13/21.  Please call and inform patient that I have entered an order for treatment with positive airway pressure (PAP) treatment for obstructive sleep apnea (OSA). She did fairly well during the latest sleep study with CPAP. We will, therefore, arrange for a machine for home use through a DME (durable medical equipment) company of Her choice; and I will see the patient back in follow-up in about 10 weeks. Please also explain to the patient that I will be looking out for compliance data, which can be downloaded from the machine (stored on an SD card, that is inserted in the machine) or via remote access through a modem, that is built into the machine. At the time of the followup appointment we will discuss sleep study results and how it is going with PAP treatment at home. Please advise patient to bring Her machine at the time of the first FU visit, even though this is cumbersome. Bringing the machine for every visit after that will likely not be needed, but often helps for the first visit to troubleshoot if needed. Please re-enforce the importance of compliance with treatment and the need for [place] to monitor compliance data - often an insurance requirement and actually good feedback for the patient as far as how they are doing.  Also remind patient, that any interim PAP machine or mask issues should be first addressed with the DME company, as they can often help better with technical and mask fit issues. Please ask if patient has a preference regarding DME company.  Please also make sure, the patient has a follow-up appointment with me in about 10 weeks from the setup date, thanks. May see one of our nurse practitioners if needed for proper timing of the FU appointment.  Please fax or rout report to the referring provider.  Thanks,   Huston Foley, MD, PhD [facility])

## 2021-02-25 NOTE — Telephone Encounter (Signed)
I called pt. I advised pt that Dr. Frances Furbish reviewed their sleep study results and found that pt did well during the titration study on CPAP. Dr. Frances Furbish recommends that pt start on CPAP for treatment. I reviewed PAP compliance expectations with the pt. Pt is agreeable to starting a CPAP. I advised pt that an order will be sent to a DME, [facility], and [facility] will call the pt within about one week after they file with the pt's insurance. [facility] will show the pt how to use the machine, fit for masks, and troubleshoot the CPAP if needed. Pt was advised of the national shortage of machines right now and that start dates can be 6-12 weeks out. Pt will call back once she has started her CPAP machine. A letter with all of this information in it will be mailed to the pt as a reminder. I verified with the pt that the address we have on file is correct. Pt verbalized understanding of results. Pt had no questions at this time but was encouraged to call back if questions arise. I have sent the order to [employer] and have received confirmation that they have received the order.

## 2021-05-21 ENCOUNTER — Other Ambulatory Visit (HOSPITAL_BASED_OUTPATIENT_CLINIC_OR_DEPARTMENT_OTHER): Payer: Self-pay

## 2021-05-21 ENCOUNTER — Ambulatory Visit: Payer: Medicare Other | Attending: Internal Medicine

## 2021-05-21 ENCOUNTER — Other Ambulatory Visit: Payer: Self-pay

## 2021-05-21 DIAGNOSIS — Z23 Encounter for immunization: Secondary | ICD-10-CM

## 2021-05-21 MED ORDER — PFIZER-BIONT COVID-19 VAC-TRIS 30 MCG/0.3ML IM SUSP
INTRAMUSCULAR | 0 refills | Status: DC
  Filled 2021-05-21: qty 0.3, 1d supply, fill #0

## 2021-05-21 NOTE — Progress Notes (Signed)
   Covid-19 Vaccination Clinic  Name:  Diane Jordan    MRN: [id number] DOB: [birth year]  05/21/2021  [name] was observed post Covid-19 immunization for 15 minutes without incident. She was provided with Vaccine Information Sheet and instruction to access the V-Safe system.   [name] was instructed to call 911 with any severe reactions post vaccine: Difficulty breathing  Swelling of face and throat  A fast heartbeat  A bad rash all over body  Dizziness and weakness   Immunizations Administered     Name Date Dose VIS Date Route   PFIZER Comrnaty(Gray TOP) Covid-19 Vaccine 05/21/2021 10:16 AM 0.3 mL 10/09/2020 Intramuscular   Manufacturer: [employer], [employer]   Lot: [id number]   NDC: [id number]

## 2021-06-10 ENCOUNTER — Telehealth: Payer: Self-pay | Admitting: *Deleted

## 2021-06-10 NOTE — Telephone Encounter (Signed)
We received a fax from [employer].  Patient was set up on an Ibreeze PAP machine on 06/09/21. Patient needs a follow-up appointment between 07/10/21-09/07/21.

## 2021-08-26 ENCOUNTER — Encounter: Payer: Self-pay | Admitting: Neurology

## 2021-08-26 ENCOUNTER — Other Ambulatory Visit: Payer: Self-pay

## 2021-08-26 ENCOUNTER — Ambulatory Visit (INDEPENDENT_AMBULATORY_CARE_PROVIDER_SITE_OTHER): Payer: BC Managed Care – PPO | Admitting: Neurology

## 2021-08-26 VITALS — BP 122/66 | HR 84 | Ht 65.0 in | Wt 244.6 lb

## 2021-08-26 DIAGNOSIS — R251 Tremor, unspecified: Secondary | ICD-10-CM | POA: Diagnosis not present

## 2021-08-26 DIAGNOSIS — G4733 Obstructive sleep apnea (adult) (pediatric): Secondary | ICD-10-CM

## 2021-08-26 DIAGNOSIS — Z9989 Dependence on other enabling machines and devices: Secondary | ICD-10-CM

## 2021-08-26 NOTE — Progress Notes (Signed)
Subjective:    Patient ID: Diane Jordan is a 70 y.o. female.  HPI    Interim history:  Diane Jordan is a 70 year old right-handed woman with an underlying medical history of hypertension, diabetes, hyperlipidemia, arthritis, allergies, and severe obesity with a BMI of over 40, who presents for follow-up consultation of her tremors as well as sleep apnea.  The patient is unaccompanied today.  I first met her at the request of her endocrinologist, Dr. Chalmers Cater on 12/11/2020, at which time the patient reported history of hand tremors for the past 3 years.  We talked about tremor management and tremor triggers.  She did complain of sleep disturbances including snoring and nonrestorative sleep.  She was advised to proceed with labs and sleep study.  TSH was normal at the time.  She had a baseline sleep study, followed by a CPAP titration study Baseline sleep study from 01/02/2021 showed a sleep latency of 68.5 minutes, REM latency 108.5 minutes, sleep efficiency 70.5%.  She had an increased percentage of stage I and stage II sleep, she had 16.2% of slow-wave sleep and REM sleep was 8.2%.  She had a total AHI of 25.6/h, REM AHI was 30/h, supine sleep was practically not achieved.  She had no significant PLM's.  She was advised to return for a titration study.  She had a CPAP titration on 02/13/2021.  Sleep efficiency was 92.9%, sleep latency 25.5 minutes and REM latency 67.5 minutes.  She had an increased percentage of stage II sleep, she had a normal percentage of slow-wave sleep at 19.7% and REM sleep was reduced at 11.1%.  She was fitted with a nasal mask and titrated on CPAP from 5 cm to 10 cm.  On pressures above 7 cm she had central apneas.  Her AHI on 7 cm was 8.8/h with no central apneas noted, O2 nadir was 91% with brief supine REM sleep achieved.  I suggested a home treatment pressure at 7 cm due to treatment emergent central apneas noted during sleep study.  Her set up date was 06/09/2021.  She has an  [id number].   Today, 08/26/2021: I reviewed her CPAP compliance data from 06/17/2021 through 07/16/2021, which is a total of 30 days, during which time she used her machine 27 days with percent used days greater than 4 hours at 87%, indicating very good compliance with an average usage of 7.2 hours for days on treatment, residual slightly elevated at 7.5/h, leak acceptable and sometimes higher, with a 95th percentile at 19.5 L/min, pressure at 7 cm.  She reports that she has adjusted well to treatment, denies any significant mouth dryness but sometimes depression feels quite high when she first turns the machine on.  She uses a full facemask.  She would not be opposed to trying a slightly higher pressure.  She is not sure if she has a ramp time on her machine currently.  She wakes up better rested and sleep quality is improved, daytime tiredness is better and she also feels that her tremors are better.  She attributes this to sleeping better.  She is very motivated to continue with treatment.    Previously:   12/11/20: (She) reports a history of hand tremors for maybe 3 years.  They are intermittent, not very bothersome, mostly in both hands and noticeable with handwriting or when she points towards something.  Generally speaking, she is not impaired in her day-to-day functioning.  She works as an Acupuncturist at [employer].  She does  not drink any caffeine.  She initially denied any family history of tremors but upon further asking, she does report a hand tremor in her sister who has dementia.  She has no family history of Parkinson's disease.  She has 3 grown children, none with tremor issues.  She lives with her husband.  Of note, she does not always sleep very well.  She has nocturia once per night, she occasionally wakes up with a headache and snoring has been noted by her husband who has encouraged her to get checked out for this.  He has a CPAP machine so she is familiar with the diagnosis of  obstructive sleep apnea.   I reviewed your office note from 10/15/2020.  She had blood work on 10/08/2020, BUN was 23, glucose 86, creatinine elevated at 1.3, cholesterol 206, HDL 70, LDL 113, triglycerides 115, A1c on 10/15/2020 was 6.1.  She also had a vitamin B12 level and vitamin D level checked which reportedly were normal on 10/15/2020.  B12 was 541, vitamin D 37.8.    She tries to hydrate well with water.  She denies any other neurological symptoms.   Of note, she had a brain MRI without contrast on 04/17/2013 and I reviewed the results:  IMPRESSION:  No acute abnormality.     The patient's allergies, current medications, family history, past medical history, past social history, past surgical history and problem list were reviewed and updated as appropriate.    Her Past Medical History Is Significant For: Past Medical History:  Diagnosis Date   Arthritis    DM (diabetes mellitus) ([place])    HTN (hypertension)    Postmenopausal     Her Past Surgical History Is Significant For: Past Surgical History:  Procedure Laterality Date   TUBAL LIGATION     1982    Her Family History Is Significant For: Family History  Problem Relation Age of Onset   Pulmonary fibrosis Mother    Heart disease Mother    High blood pressure Father    Diabetes Father    Sleep apnea Neg Hx     Her Social History Is Significant For: Social History   Socioeconomic History   Marital status: Married    Spouse name: Not on file   Number of children: 3   Years of education: Not on file   Highest education level: Not on file  Occupational History    Employer: [employer]  Tobacco Use   Smoking status: Never   Smokeless tobacco: Never  Vaping Use   Vaping Use: Never used  Substance and Sexual Activity   Alcohol use: No   Drug use: No   Sexual activity: Yes    Partners: Male    Birth control/protection: Pill, Surgical, Post-menopausal  Other Topics Concern   Not on file  Social  History Narrative   Not on file   Social Determinants of Health   Financial Resource Strain: Not on file  Food Insecurity: Not on file  Transportation Needs: Not on file  Physical Activity: Not on file  Stress: Not on file  Social Connections: Not on file    Her Allergies Are:  Allergies  Allergen Reactions   Capsules, Empty Gelatin [Gelatin]     Any capsule form med causes itching.    Fluticasone Propionate Itching   Prinivil [Lisinopril] Itching  :   Her Current Medications Are:  Outpatient Encounter Medications as of 08/26/2021  Medication Sig   aspirin 81 MG EC tablet 1 tablet  COVID-19 mRNA Vac-TriS, Pfizer, (PFIZER-BIONT COVID-19 VAC-TRIS) SUSP injection Inject into the muscle.   famotidine (PEPCID) 20 MG tablet famotidine 20 mg tablet  TAKE 1 TABLET BY MOUTH TWICE A DAY   fexofenadine (ALLEGRA) 30 MG tablet Take 30 mg by mouth daily.   hydrOXYzine (ATARAX/VISTARIL) 25 MG tablet Take 25 mg by mouth as needed.   insulin aspart protamine - aspart (NOVOLOG MIX 70/30 FLEXPEN) (70-30) 100 UNIT/ML FlexPen 40am/35upm   metFORMIN (GLUCOPHAGE) 500 MG tablet Take 500 mg by mouth 2 (two) times daily with a meal.   Multiple Vitamin (MULTIVITAMIN WITH MINERALS) TABS Take 1 tablet by mouth daily.   No facility-administered encounter medications on file as of 08/26/2021.  :  Review of Systems:  Out of a complete 14 point review of systems, all are reviewed and negative with the exception of these symptoms as listed below:  Review of Systems  Neurological:        Pt is here for follow up visit for CPAP . Pt states she is doing well and has no questions or concerns for this appointment   ESS 2 FSS:9   Objective:  Neurological Exam  Physical Exam Physical Examination:   Vitals:   08/26/21 1414  BP: 122/66  Pulse: 84    General Examination: The patient is a very pleasant 70 y.o. female in no acute distress. She appears well-developed and well-nourished and well  groomed.   HEENT: Normocephalic, atraumatic, pupils are equal, round and reactive to light and accommodation. Extraocular tracking is good without limitation to gaze excursion or nystagmus noted. Normal smooth pursuit is noted. Hearing is grossly intact. Face is symmetric with normal facial animation and normal facial sensation. Speech is clear with no dysarthria noted. There is no hypophonia. There is no lip, neck/head, jaw or voice tremor. Neck is supple with full range of passive and active motion. There are no carotid bruits on auscultation. Oropharynx exam reveals: mild mouth dryness, adequate dental hygiene and moderate airway crowding. Tongue protrudes centrally and palate elevates symmetrically.     Chest: Clear to auscultation without wheezing, rhonchi or crackles noted.   Heart: S1+S2+0, regular and normal without murmurs, rubs or gallops noted.    Abdomen: Soft, non-tender and non-distended.   Extremities: There is trace edema in the distal lower extremities bilaterally.    Skin: Warm and dry without trophic changes noted.   Musculoskeletal: exam reveals no obvious joint deformities.    Neurologically:  Mental status: The patient is awake, alert and oriented in all 4 spheres. Her immediate and remote memory, attention, language skills and fund of knowledge are appropriate. There is no evidence of aphasia, agnosia, apraxia or anomia. Speech is clear with normal prosody and enunciation. Thought process is linear. Mood is normal and affect is normal.  Cranial nerves II - XII are as described above under HEENT exam.  Motor exam: Normal bulk, strength and tone is noted. There is no drift, resting tremor or rebound.    (On 12/11/2020: On Archimedes spiral drawing she has slight trembling with the left and right hand, handwriting with the right hand is slightly tremulous, legible, not micrographic.)    She does not have any postural or action tremor, no intention tremor.  No lower extremity  tremor noted.  No resting tremor. Fine motor skills and coordination: intact in the upper and lower extremities.   Cerebellar testing: No dysmetria or intention tremor. There is no truncal or gait ataxia.  Sensory exam: intact to light touch  in the upper and lower extremities.   Gait, station and balance: She stands easily. No veering to one side is noted. No leaning to one side is noted. Posture is age-appropriate and stance is narrow based. Gait shows normal stride length and normal pace. No problems turning are noted.    Assessment and Plan:    In summary, SOPHIRA RUMLER is a very pleasant 70 year old female with an underlying medical history of hypertension, diabetes, hyperlipidemia, arthritis, allergies, and severe obesity with a BMI of over 40, who presents for follow-up consultation of her hand tremor as well as her sleep apnea which was deemed in the moderate range by baseline sleep testing in March 2022.  She had a subsequent titration study in April 2022 and has treatment with CPAP therapy since 06/09/2021.  She is compliant with treatment.  She has noticed improvement in her tremor and attributes this to sleeping better.  She noticed benefit with regards to her sleep quality and daytime energy as well as sleep consolidation after starting CPAP.  She tolerates it well.  Her titration study showed some treatment emergent central apneas.  We talked about her sleep study results in detail as well as reviewed her compliance data today.  She is advised to try an increased pressure of 8 cm at this time.  Her residual AHI is mildly elevated and there is no evidence of central apneas according to the download report.  She is furthermore advised to try a ramp time of 15 minutes from 4 to 8 cm.  I will make these changes through her DME company.  She is agreeable to this.  She is commended for her treatment adherence.  She is motivated to continue with treatment.  At this juncture, she is reassured with  regards to her tremor as well.  She is advised to follow-up routinely in sleep clinic to see one of our nurse practitioners in 1 year.  She is encouraged to call us or email [place] through [place] with any interim questions or concerns.  I answered all her questions today and she was in agreement.   I spent 30 minutes in total face-to-face time and in reviewing records during pre-charting, more than 50% of which was spent in counseling and coordination of care, reviewing test results, reviewing medications and treatment regimen and/or in discussing or reviewing the diagnosis of OSA, the prognosis and treatment options. Pertinent laboratory and imaging test results that were available during this visit with the patient were reviewed by me and considered in my medical decision making (see chart for details).

## 2021-08-26 NOTE — Patient Instructions (Signed)

## 2021-11-03 DIAGNOSIS — B351 Tinea unguium: Secondary | ICD-10-CM | POA: Diagnosis not present

## 2021-11-09 DIAGNOSIS — G4733 Obstructive sleep apnea (adult) (pediatric): Secondary | ICD-10-CM | POA: Diagnosis not present

## 2021-11-25 DIAGNOSIS — B351 Tinea unguium: Secondary | ICD-10-CM | POA: Diagnosis not present

## 2021-12-03 DIAGNOSIS — N189 Chronic kidney disease, unspecified: Secondary | ICD-10-CM | POA: Diagnosis not present

## 2021-12-14 DIAGNOSIS — B351 Tinea unguium: Secondary | ICD-10-CM | POA: Diagnosis not present

## 2021-12-30 DIAGNOSIS — B351 Tinea unguium: Secondary | ICD-10-CM | POA: Diagnosis not present

## 2022-01-05 DIAGNOSIS — G4733 Obstructive sleep apnea (adult) (pediatric): Secondary | ICD-10-CM | POA: Diagnosis not present

## 2022-01-07 DIAGNOSIS — G4733 Obstructive sleep apnea (adult) (pediatric): Secondary | ICD-10-CM | POA: Diagnosis not present

## 2022-01-19 DIAGNOSIS — B351 Tinea unguium: Secondary | ICD-10-CM | POA: Diagnosis not present

## 2022-01-19 DIAGNOSIS — M21621 Bunionette of right foot: Secondary | ICD-10-CM | POA: Diagnosis not present

## 2022-02-05 DIAGNOSIS — G4733 Obstructive sleep apnea (adult) (pediatric): Secondary | ICD-10-CM | POA: Diagnosis not present

## 2022-02-07 DIAGNOSIS — G4733 Obstructive sleep apnea (adult) (pediatric): Secondary | ICD-10-CM | POA: Diagnosis not present

## 2022-02-15 DIAGNOSIS — E7800 Pure hypercholesterolemia, unspecified: Secondary | ICD-10-CM | POA: Diagnosis not present

## 2022-02-15 DIAGNOSIS — E1165 Type 2 diabetes mellitus with hyperglycemia: Secondary | ICD-10-CM | POA: Diagnosis not present

## 2022-02-22 DIAGNOSIS — E1165 Type 2 diabetes mellitus with hyperglycemia: Secondary | ICD-10-CM | POA: Diagnosis not present

## 2022-02-22 DIAGNOSIS — Z800 Family history of malignant neoplasm of digestive organs: Secondary | ICD-10-CM | POA: Diagnosis not present

## 2022-02-22 DIAGNOSIS — Z789 Other specified health status: Secondary | ICD-10-CM | POA: Diagnosis not present

## 2022-02-22 DIAGNOSIS — E7800 Pure hypercholesterolemia, unspecified: Secondary | ICD-10-CM | POA: Diagnosis not present

## 2022-02-22 DIAGNOSIS — I10 Essential (primary) hypertension: Secondary | ICD-10-CM | POA: Diagnosis not present

## 2022-02-22 DIAGNOSIS — R202 Paresthesia of skin: Secondary | ICD-10-CM | POA: Diagnosis not present

## 2022-02-23 DIAGNOSIS — M21621 Bunionette of right foot: Secondary | ICD-10-CM | POA: Diagnosis not present

## 2022-02-23 DIAGNOSIS — B351 Tinea unguium: Secondary | ICD-10-CM | POA: Diagnosis not present

## 2022-02-24 ENCOUNTER — Other Ambulatory Visit: Payer: Self-pay | Admitting: Endocrinology

## 2022-02-24 DIAGNOSIS — E1165 Type 2 diabetes mellitus with hyperglycemia: Secondary | ICD-10-CM

## 2022-03-09 DIAGNOSIS — G4733 Obstructive sleep apnea (adult) (pediatric): Secondary | ICD-10-CM | POA: Diagnosis not present

## 2022-03-22 ENCOUNTER — Encounter (HOSPITAL_COMMUNITY): Payer: Self-pay

## 2022-03-22 ENCOUNTER — Emergency Department (HOSPITAL_COMMUNITY)
Admission: EM | Admit: 2022-03-22 | Discharge: 2022-03-22 | Disposition: A | Discharge status: Home / Self Care | Payer: Managed Care, Other (non HMO) | Attending: Emergency Medicine | Admitting: Emergency Medicine

## 2022-03-22 ENCOUNTER — Emergency Department (HOSPITAL_COMMUNITY): Payer: Managed Care, Other (non HMO)

## 2022-03-22 DIAGNOSIS — R112 Nausea with vomiting, unspecified: Secondary | ICD-10-CM | POA: Insufficient documentation

## 2022-03-22 DIAGNOSIS — R42 Dizziness and giddiness: Secondary | ICD-10-CM | POA: Diagnosis not present

## 2022-03-22 DIAGNOSIS — Z7982 Long term (current) use of aspirin: Secondary | ICD-10-CM | POA: Diagnosis not present

## 2022-03-22 DIAGNOSIS — R404 Transient alteration of awareness: Secondary | ICD-10-CM | POA: Diagnosis not present

## 2022-03-22 DIAGNOSIS — I10 Essential (primary) hypertension: Secondary | ICD-10-CM | POA: Insufficient documentation

## 2022-03-22 DIAGNOSIS — Z7984 Long term (current) use of oral hypoglycemic drugs: Secondary | ICD-10-CM | POA: Diagnosis not present

## 2022-03-22 DIAGNOSIS — G9389 Other specified disorders of brain: Secondary | ICD-10-CM | POA: Diagnosis not present

## 2022-03-22 DIAGNOSIS — R29818 Other symptoms and signs involving the nervous system: Secondary | ICD-10-CM | POA: Diagnosis not present

## 2022-03-22 DIAGNOSIS — I499 Cardiac arrhythmia, unspecified: Secondary | ICD-10-CM | POA: Diagnosis not present

## 2022-03-22 DIAGNOSIS — Z743 Need for continuous supervision: Secondary | ICD-10-CM | POA: Diagnosis not present

## 2022-03-22 DIAGNOSIS — E119 Type 2 diabetes mellitus without complications: Secondary | ICD-10-CM | POA: Diagnosis not present

## 2022-03-22 DIAGNOSIS — R110 Nausea: Secondary | ICD-10-CM

## 2022-03-22 DIAGNOSIS — R6889 Other general symptoms and signs: Secondary | ICD-10-CM | POA: Diagnosis not present

## 2022-03-22 DIAGNOSIS — I6782 Cerebral ischemia: Secondary | ICD-10-CM | POA: Diagnosis not present

## 2022-03-22 DIAGNOSIS — Z794 Long term (current) use of insulin: Secondary | ICD-10-CM | POA: Diagnosis not present

## 2022-03-22 DIAGNOSIS — R519 Headache, unspecified: Secondary | ICD-10-CM | POA: Diagnosis not present

## 2022-03-22 LAB — CBC WITH DIFFERENTIAL/PLATELET
Abs Immature Granulocytes: 0.02 10*3/uL (ref 0.00–0.07)
Basophils Absolute: 0 10*3/uL (ref 0.0–0.1)
Basophils Relative: 1 %
Eosinophils Absolute: 0.4 10*3/uL (ref 0.0–0.5)
Eosinophils Relative: 6 %
HCT: 37.5 % (ref 36.0–46.0)
Hemoglobin: 12.3 g/dL (ref 12.0–15.0)
Immature Granulocytes: 0 %
Lymphocytes Relative: 35 %
Lymphs Abs: 2.4 10*3/uL (ref 0.7–4.0)
MCH: 29.7 pg (ref 26.0–34.0)
MCHC: 32.8 g/dL (ref 30.0–36.0)
MCV: 90.6 fL (ref 80.0–100.0)
Monocytes Absolute: 0.7 10*3/uL (ref 0.1–1.0)
Monocytes Relative: 9 %
Neutro Abs: 3.5 10*3/uL (ref 1.7–7.7)
Neutrophils Relative %: 49 %
Platelets: 317 10*3/uL (ref 150–400)
RBC: 4.14 MIL/uL (ref 3.87–5.11)
RDW: 13 % (ref 11.5–15.5)
WBC: 7 10*3/uL (ref 4.0–10.5)
nRBC: 0 % (ref 0.0–0.2)

## 2022-03-22 LAB — COMPREHENSIVE METABOLIC PANEL
ALT: 20 U/L (ref 0–44)
AST: 27 U/L (ref 15–41)
Albumin: 3.5 g/dL (ref 3.5–5.0)
Alkaline Phosphatase: 87 U/L (ref 38–126)
Anion gap: 9 (ref 5–15)
BUN: 40 mg/dL — ABNORMAL HIGH (ref 8–23)
CO2: 25 mmol/L (ref 22–32)
Calcium: 9.4 mg/dL (ref 8.9–10.3)
Chloride: 104 mmol/L (ref 98–111)
Creatinine, Ser: 1.61 mg/dL — ABNORMAL HIGH (ref 0.44–1.00)
GFR, Estimated: 35 mL/min — ABNORMAL LOW (ref >60)
Glucose, Bld: 93 mg/dL (ref 70–99)
Potassium: 3.6 mmol/L (ref 3.5–5.1)
Sodium: 138 mmol/L (ref 135–145)
Total Bilirubin: 0.7 mg/dL (ref 0.3–1.2)
Total Protein: 7.7 g/dL (ref 6.5–8.1)

## 2022-03-22 LAB — URINALYSIS, ROUTINE W REFLEX MICROSCOPIC
Bilirubin Urine: NEGATIVE
Glucose, UA: >=500 mg/dL — AB
Hgb urine dipstick: NEGATIVE
Ketones, ur: NEGATIVE mg/dL
Leukocytes,Ua: NEGATIVE
Nitrite: NEGATIVE
Protein, ur: NEGATIVE mg/dL
Specific Gravity, Urine: 1.008 (ref 1.005–1.030)
pH: 5 (ref 5.0–8.0)

## 2022-03-22 LAB — CBG MONITORING, ED: Glucose-Capillary: 86 mg/dL (ref 70–99)

## 2022-03-22 LAB — TROPONIN I (HIGH SENSITIVITY)
Troponin I (High Sensitivity): 4 ng/L (ref <18)
Troponin I (High Sensitivity): 4 ng/L (ref <18)

## 2022-03-22 LAB — LIPASE, BLOOD: Lipase: 33 U/L (ref 11–51)

## 2022-03-22 MED ORDER — SODIUM CHLORIDE 0.9 % IV BOLUS
1000.0000 mL | Freq: Once | INTRAVENOUS | Status: AC
  Administered 2022-03-22: 1000 mL via INTRAVENOUS

## 2022-03-22 MED ORDER — ONDANSETRON 4 MG PO TBDP: 4.0000 mg | ORAL_TABLET | Freq: Three times a day (TID) | ORAL | 0 refills | Status: DC | PRN

## 2022-03-22 MED ORDER — MECLIZINE HCL 25 MG PO TABS
25.0000 mg | ORAL_TABLET | Freq: Once | ORAL | Status: AC
  Administered 2022-03-22: 25 mg via ORAL
  Filled 2022-03-22: qty 1

## 2022-03-22 MED ORDER — MECLIZINE HCL 25 MG PO TABS: 25.0000 mg | ORAL_TABLET | Freq: Three times a day (TID) | ORAL | 0 refills | Status: AC | PRN

## 2022-03-22 MED ORDER — LORAZEPAM 2 MG/ML IJ SOLN
0.5000 mg | INTRAMUSCULAR | Status: DC | PRN
  Administered 2022-03-22: 0.5 mg via INTRAVENOUS
  Filled 2022-03-22: qty 1

## 2022-03-22 NOTE — ED Provider Notes (Signed)
[facility] DEPT Provider Note   CSN: [id number] Arrival date & time: 03/22/22  [id number]     History  No chief complaint on file.   Diane Jordan is a 71 y.o. female with a medical history of diabetes mellitus, hypertension, arthritis.  Presents to the emergency department with a chief complaint of lightheadedness, dizziness, nausea, and vomiting.  Patient reports that last night she woke up to go to the bathroom and felt very dizzy and lightheaded.  Symptoms have been persistent since then.  Dizziness is worse with changing position.  Patient reports that she has felt nauseous throughout the night.  Had 1 episode of vomiting.  Per EMS emesis was stomach contents.    Denies any fever, chills, abdominal pain, constipation, diarrhea, blood in stool, melena, chest pain, palpitations, shortness of breath, dysuria, hematuria, urinary urgency, vaginal pain, vaginal bleeding, vaginal discharge, numbness, weakness, facial asymmetry, visual disturbance, syncope, seizure.  Patient denies any illicit drug use, alcohol use, history of abdominal surgery, recent falls/traumatic injuries.  Patient reports that she was switched from metformin to [place].  Approximate 1 month prior.  HPI     Home Medications Prior to Admission medications   Medication Sig Start Date End Date Taking? Authorizing Provider  aspirin 81 MG EC tablet 1 tablet    [provider]  COVID-19 mRNA Vac-TriS, Pfizer, (PFIZER-BIONT COVID-19 VAC-TRIS) SUSP injection Inject into the muscle. 05/21/21   Carlyle Basques, MD  famotidine (PEPCID) 20 MG tablet famotidine 20 mg tablet  TAKE 1 TABLET BY MOUTH TWICE A DAY 01/22/20   [provider]  fexofenadine (ALLEGRA) 30 MG tablet Take 30 mg by mouth daily.    [provider]  hydrOXYzine (ATARAX/VISTARIL) 25 MG tablet Take 25 mg by mouth as needed.    [provider]  insulin aspart protamine - aspart (NOVOLOG MIX 70/30  FLEXPEN) (70-30) 100 UNIT/ML FlexPen 40am/35upm 09/02/16   [provider]  metFORMIN (GLUCOPHAGE) 500 MG tablet Take 500 mg by mouth 2 (two) times daily with a meal.    [provider]  Multiple Vitamin (MULTIVITAMIN WITH MINERALS) TABS Take 1 tablet by mouth daily.    [provider]      Allergies    Capsules, empty gelatin [gelatin]; Fluticasone propionate; and Prinivil [lisinopril]    Review of Systems   Review of Systems  Constitutional:  Negative for chills and fever.  Eyes:  Negative for visual disturbance.  Respiratory:  Negative for shortness of breath.   Cardiovascular:  Negative for chest pain, palpitations and leg swelling.  Gastrointestinal:  Positive for nausea and vomiting. Negative for abdominal distention, abdominal pain, anal bleeding, blood in stool, constipation, diarrhea and rectal pain.  Genitourinary:  Negative for difficulty urinating, dysuria, flank pain, frequency, hematuria, urgency, vaginal bleeding, vaginal discharge and vaginal pain.  Musculoskeletal:  Negative for back pain and neck pain.  Skin:  Negative for color change and rash.  Neurological:  Positive for dizziness and light-headedness. Negative for tremors, seizures, syncope, facial asymmetry, speech difficulty, weakness, numbness and headaches.  Psychiatric/Behavioral:  Negative for confusion.    Physical Exam Updated Vital Signs BP 135/85 (BP Location: Left Arm)   Pulse 66   Temp (!) 97.5 F (36.4 C) (Oral)   Resp 18   SpO2 100%  Physical Exam Vitals and nursing note reviewed.  Constitutional:      General: She is not in acute distress.    Appearance: She is not ill-appearing, toxic-appearing or diaphoretic.  Eyes:     General:        Right eye: No discharge.        Left eye: No discharge.     Extraocular Movements: Extraocular movements intact.     Right eye: Nystagmus present.     Left eye: Nystagmus present.     Conjunctiva/sclera: Conjunctivae normal.      Pupils: Pupils are equal, round, and reactive to light.     Comments: Horizontal nystagmus bilaterally  Cardiovascular:     Rate and Rhythm: Normal rate.     Pulses:          Radial pulses are 2+ on the right side and 2+ on the left side.     Heart sounds: Normal heart sounds, S1 normal and S2 normal. Heart sounds not distant. No murmur heard. Pulmonary:     Effort: Pulmonary effort is normal.  Abdominal:     General: Abdomen is protuberant. Bowel sounds are normal. There is no distension. There are no signs of injury.     Palpations: Abdomen is soft. There is no mass or pulsatile mass.     Tenderness: There is no abdominal tenderness. There is no guarding or rebound.  Musculoskeletal:     Cervical back: Normal range of motion and neck supple. No rigidity.  Skin:    General: Skin is warm and dry.  Neurological:     General: No focal deficit present.     Mental Status: She is alert.     GCS: GCS eye subscore is 4. GCS verbal subscore is 5. GCS motor subscore is 6.     Cranial Nerves: No cranial nerve deficit or facial asymmetry.     Sensory: Sensation is intact.     Motor: No weakness, tremor, seizure activity or pronator drift.     Coordination: Finger-Nose-Finger Test normal.     Comments: CN II-XII intact; performed in supine position due to dizziness, +5 strength to bilateral upper extremities, +5 strength to dorsiflexion and plantarflexion, patient able to lift both legs against gravity and hold each there without difficulty   Psychiatric:        Behavior: Behavior is cooperative.    ED Results / Procedures / Treatments   Labs (all labs ordered are listed, but only abnormal results are displayed) Labs Reviewed - No data to display  EKG None  Radiology No results found.  Procedures Procedures    Medications Ordered in ED Medications - No data to display  ED Course/ Medical Decision Making/ A&P Clinical Course as of 03/22/22 1234  Mon Mar 22, 2022  0939 Patient  reports that her dizziness has improved after fluids and meclizine.  States that she does still feel off balance while ambulating to the bathroom.  Patient was ambulated with assistance x1. [PB]    Clinical Course User Index [PB] Loni Beckwith, PA-C                           Medical Decision Making Amount and/or Complexity of Data Reviewed Labs: ordered. Radiology: ordered.  Risk Prescription drug management.   Alert 71 year old female no distress, nontoxic-appearing.  Presents emerged department complaint of headedness, dizziness, nausea, and vomiting.  Information obtained from patient patient's husband at bedside.  Past medical records reviewed including previous further notes, labs, and imaging.  Patient has medical history as outlined in HPI which complicates her care.  The patient's reports of lightheadedness and dizziness we will obtain  chest x-ray, troponin, basic lab work, CBG, CTA head and neck.  With reports of nausea and vomiting we will add on lipase and urinalysis.    Patient's neuro exam is reassuring no signs of focal deficits.  Patient does have a reproduction of dizziness with change in head position and moving from Semi-Fowler's to seated position.  Concern for possible vertigo.  We will give patient dose of meclizine at this time.  I personally viewed and interpreted patient's EKG.  Tracing shows sinus rhythm.  I personally viewed interpret patient's lab results.  Pertinent findings include: -CBG 86 -CBC unremarkable -CMP shows creatinine 1.6 with BUN of 40, last available labs to compare to her 5 years prior. -Urinalysis shows no signs of infection -Lipase within normal limits -Troponin 4 and 4 delta of 0; low suspicion for ACS at this time.  I personally viewed and interpret patient's chest x-ray.  Agree with radiology interpretation of borderline cardiomegaly.    MRI brain without contrast was limited due to artifact from patient's braces however  within this limitation no visible acute intracranial abnormality.  Patient continues to report feeling better with marked improvement in her dizziness.  Patient is able to stand and ambulate at a steady gait with nurse technician.  Patient able to tolerate p.o. intake without difficulty.  We will discharge patient at this time with short course of meclizine.  Patient to follow-up closely with her primary care provider.  Patient care discussed with attending physician Dr. Melina Copa.  Based on patient's chief complaint, I considered admission might be necessary, however after reassuring ED workup feel patient is reasonable for discharge.  Discussed results, findings, treatment and follow up. Patient advised of return precautions. Patient verbalized understanding and agreed with plan.  Portions of this note were generated with [employer]. Dictation errors may occur despite best attempts at proofreading.         Final Clinical Impression(s) / ED Diagnoses Final diagnoses:  None    Rx / DC Orders ED Discharge Orders     None         Dyann Ruddle 03/22/22 1237    Fatima Blank, MD 03/23/22 [phone]

## 2022-03-22 NOTE — Discharge Instructions (Addendum)
You came to the emergency department today to be evaluated for your dizziness, nausea, and vomiting.  Your physical exam was reassuring.  The lab work obtained showed that your kidney function was slightly elevated.  This may be baseline for you or due to dehydration.  Please follow-up with your primary care doctor for repeat evaluation.  It is very important that you stay well-hydrated drinking lots of water over the next few days.  I have also given you a short course of Zofran to take as needed for nausea and vomiting.  The MRI of your brain did not show any obvious signs of stroke.  Your symptoms improved after receiving meclizine and fluids.  I have given you a short course of meclizine to use as needed for dizziness.  Get help right away if: You vomit or have diarrhea and are unable to eat or drink anything. You have problems talking, walking, swallowing, or using your arms, hands, or legs. You feel generally weak. You have any bleeding. You are not thinking clearly or you have trouble forming sentences. It may take a friend or family member to notice this. You have chest pain, abdominal pain, shortness of breath, or sweating. Your vision changes or you develop a severe headache.

## 2022-03-22 NOTE — ED Triage Notes (Signed)
Pt BIBA from home c/o lightheadedness, NV since midnight. 1 episode emesis with EMS. Denies abdominal pain/CP, sick contacts. Stroke screen negative. 12 lead unremarkable. 4mg  zofran though 22ga RAC. Hx DM  164/73 Hr 65 100% RA CBG 97

## 2022-03-24 ENCOUNTER — Ambulatory Visit
Admission: RE | Admit: 2022-03-24 | Discharge: 2022-03-24 | Disposition: A | Discharge status: Home / Self Care | Payer: No Typology Code available for payment source | Source: Ambulatory Visit | Attending: Endocrinology | Admitting: Endocrinology

## 2022-03-24 DIAGNOSIS — E1165 Type 2 diabetes mellitus with hyperglycemia: Secondary | ICD-10-CM

## 2022-04-09 DIAGNOSIS — G4733 Obstructive sleep apnea (adult) (pediatric): Secondary | ICD-10-CM | POA: Diagnosis not present

## 2022-04-14 DIAGNOSIS — G4733 Obstructive sleep apnea (adult) (pediatric): Secondary | ICD-10-CM | POA: Diagnosis not present

## 2022-04-19 DIAGNOSIS — B351 Tinea unguium: Secondary | ICD-10-CM | POA: Diagnosis not present

## 2022-05-09 DIAGNOSIS — G4733 Obstructive sleep apnea (adult) (pediatric): Secondary | ICD-10-CM | POA: Diagnosis not present

## 2022-05-12 ENCOUNTER — Other Ambulatory Visit: Payer: Self-pay | Admitting: Obstetrics and Gynecology

## 2022-05-12 DIAGNOSIS — Z800 Family history of malignant neoplasm of digestive organs: Secondary | ICD-10-CM

## 2022-05-13 DIAGNOSIS — Z79899 Other long term (current) drug therapy: Secondary | ICD-10-CM | POA: Diagnosis not present

## 2022-05-13 DIAGNOSIS — E7800 Pure hypercholesterolemia, unspecified: Secondary | ICD-10-CM | POA: Diagnosis not present

## 2022-05-13 DIAGNOSIS — Z0000 Encounter for general adult medical examination without abnormal findings: Secondary | ICD-10-CM | POA: Diagnosis not present

## 2022-05-13 DIAGNOSIS — N1831 Chronic kidney disease, stage 3a: Secondary | ICD-10-CM | POA: Diagnosis not present

## 2022-05-13 DIAGNOSIS — I2510 Atherosclerotic heart disease of native coronary artery without angina pectoris: Secondary | ICD-10-CM | POA: Diagnosis not present

## 2022-05-13 DIAGNOSIS — I10 Essential (primary) hypertension: Secondary | ICD-10-CM | POA: Diagnosis not present

## 2022-05-13 DIAGNOSIS — I700 Atherosclerosis of aorta: Secondary | ICD-10-CM | POA: Diagnosis not present

## 2022-05-13 DIAGNOSIS — E1169 Type 2 diabetes mellitus with other specified complication: Secondary | ICD-10-CM | POA: Diagnosis not present

## 2022-05-14 DIAGNOSIS — G4733 Obstructive sleep apnea (adult) (pediatric): Secondary | ICD-10-CM | POA: Diagnosis not present

## 2022-05-23 ENCOUNTER — Ambulatory Visit
Admission: RE | Admit: 2022-05-23 | Discharge: 2022-05-23 | Disposition: A | Discharge status: Home / Self Care | Payer: Managed Care, Other (non HMO) | Source: Ambulatory Visit | Attending: Obstetrics and Gynecology | Admitting: Obstetrics and Gynecology

## 2022-05-23 DIAGNOSIS — N281 Cyst of kidney, acquired: Secondary | ICD-10-CM | POA: Diagnosis not present

## 2022-05-23 DIAGNOSIS — Z800 Family history of malignant neoplasm of digestive organs: Secondary | ICD-10-CM

## 2022-05-23 DIAGNOSIS — K449 Diaphragmatic hernia without obstruction or gangrene: Secondary | ICD-10-CM | POA: Diagnosis not present

## 2022-05-23 DIAGNOSIS — R935 Abnormal findings on diagnostic imaging of other abdominal regions, including retroperitoneum: Secondary | ICD-10-CM | POA: Diagnosis not present

## 2022-05-23 MED ORDER — GADOBENATE DIMEGLUMINE 529 MG/ML IV SOLN
20.0000 mL | Freq: Once | INTRAVENOUS | Status: AC | PRN
  Administered 2022-05-23: 20 mL via INTRAVENOUS

## 2022-06-09 DIAGNOSIS — G4733 Obstructive sleep apnea (adult) (pediatric): Secondary | ICD-10-CM | POA: Diagnosis not present

## 2022-06-14 DIAGNOSIS — G4733 Obstructive sleep apnea (adult) (pediatric): Secondary | ICD-10-CM | POA: Diagnosis not present

## 2022-06-15 ENCOUNTER — Encounter: Payer: Self-pay | Admitting: Internal Medicine

## 2022-06-15 ENCOUNTER — Ambulatory Visit (INDEPENDENT_AMBULATORY_CARE_PROVIDER_SITE_OTHER): Payer: Managed Care, Other (non HMO) | Admitting: Internal Medicine

## 2022-06-15 VITALS — BP 118/62 | HR 83 | Ht 65.0 in | Wt 251.0 lb

## 2022-06-15 DIAGNOSIS — I2510 Atherosclerotic heart disease of native coronary artery without angina pectoris: Secondary | ICD-10-CM

## 2022-06-15 MED ORDER — EZETIMIBE 10 MG PO TABS: 10.0000 mg | ORAL_TABLET | Freq: Every day | ORAL | 3 refills | Status: DC

## 2022-06-15 NOTE — Progress Notes (Signed)
Cardiology Office Note:    Date:  06/15/2022   ID:  Diane Jordan, DOB Mar 03, 1951, MRN [id number]  PCP:  Darrow Bussing, MD   [facility] Providers Cardiologist:  Maisie Fus, MD     Referring MD: Darrow Bussing, MD   No chief complaint on file. CAC  History of Present Illness:    Diane Jordan is a 71 y.o. female with a hx of DM2, HTN, arthritis referral for CAC score 154 / 85th percentile 03/24/2022. EKG NSR. She's on ASA 81 mg daily. She tried many statins and notes HA/nosebleeds. Had nausea and dizziness with change in position in May. She notes sciatic nerve pain, she walks pain limits her exercise. No Cp or DOE. Notes that she eats chicken, not fried. Eats vegetables/fruits. Mother had cardiomyoapthy in her 35s. No smoking  Past Medical History:  Diagnosis Date   Arthritis    DM (diabetes mellitus) (HCC)    HTN (hypertension)    Postmenopausal     Past Surgical History:  Procedure Laterality Date   TUBAL LIGATION     1982    Current Medications: Current Meds  Medication Sig   aspirin 81 MG EC tablet 1 tablet   chlorthalidone (HYGROTON) 25 MG tablet Take 25 mg by mouth every morning.   ezetimibe (ZETIA) 10 MG tablet Take 1 tablet (10 mg total) by mouth daily.   famotidine (PEPCID) 20 MG tablet famotidine 20 mg tablet  TAKE 1 TABLET BY MOUTH TWICE A DAY   fexofenadine (ALLEGRA) 30 MG tablet Take 30 mg by mouth daily.   hydrOXYzine (ATARAX/VISTARIL) 25 MG tablet Take 25 mg by mouth as needed.   insulin aspart protamine - aspart (NOVOLOG MIX 70/30 FLEXPEN) (70-30) 100 UNIT/ML FlexPen 40am/35upm   irbesartan (AVAPRO) 150 MG tablet Take 150 mg by mouth daily.   JARDIANCE 25 MG TABS tablet Take 25 mg by mouth daily.   meclizine (ANTIVERT) 25 MG tablet Take 1 tablet (25 mg total) by mouth 3 (three) times daily as needed for dizziness.   Multiple Vitamin (MULTIVITAMIN WITH MINERALS) TABS Take 1 tablet by mouth daily.     Allergies:   Capsules, empty  gelatin [gelatin]; Fluticasone propionate; and Prinivil [lisinopril]   Social History   Socioeconomic History   Marital status: Married    Spouse name: Not on file   Number of children: 3   Years of education: Not on file   Highest education level: Not on file  Occupational History    Employer: [employer]  Tobacco Use   Smoking status: Never   Smokeless tobacco: Never  Vaping Use   Vaping Use: Never used  Substance and Sexual Activity   Alcohol use: No   Drug use: No   Sexual activity: Yes    Partners: Male    Birth control/protection: Pill, Surgical, Post-menopausal  Other Topics Concern   Not on file  Social History Narrative   Not on file   Social Determinants of Health   Financial Resource Strain: Not on file  Food Insecurity: Not on file  Transportation Needs: Not on file  Physical Activity: Not on file  Stress: Not on file  Social Connections: Not on file     Family History: The patient's family history includes Diabetes in her father; Heart disease in her mother; High blood pressure in her father; Pulmonary fibrosis in her mother. There is no history of Sleep apnea.  ROS:   Please see the history of present illness.  All other systems reviewed and are negative.  EKGs/Labs/Other Studies Reviewed:    The following studies were reviewed today:   EKG:  EKG is  ordered today.  The ekg ordered today demonstrates   06/15/2022- NSR, poor R wave progression (c/f habitus)  Recent Labs: 03/22/2022: ALT 20; BUN 40; Creatinine, Ser 1.61; Hemoglobin 12.3; Platelets 317; Potassium 3.6; Sodium 138   Recent Lipid Panel No results found for: "CHOL", "TRIG", "HDL", "CHOLHDL", "VLDL", "LDLCALC", "LDLDIRECT"   Risk Assessment/Calculations:           Physical Exam:    VS:   Vitals:   06/15/22 1325  BP: 118/62  Pulse: 83  SpO2: 99%      Wt Readings from Last 3 Encounters:  06/15/22 251 lb (113.9 kg)  03/22/22 248 lb (112.5 kg)  08/26/21 244 lb  9.6 oz (110.9 kg)     GEN:  Well nourished, well developed in no acute distress HEENT: Normal NECK: No JVD; No carotid bruits LYMPHATICS: No lymphadenopathy CARDIAC: RRR, no murmurs, rubs, gallops RESPIRATORY:  Clear to auscultation without rales, wheezing or rhonchi  ABDOMEN: Soft, non-tender, non-distended MUSCULOSKELETAL:  No edema; No deformity  SKIN: Warm and dry NEUROLOGIC:  Alert and oriented x 3 PSYCHIATRIC:  Normal affect   ASSESSMENT:   Elevated CAC: Recommend continuing baby asa. She reports statin intolerance. Discussed that her symptoms are less likely related to statins however she is concerned they are. Will start zetia. LDL goal < 70 mg /dL (last LDL 093 mg/dL).  Recommend healthy lifestyle habits such as goal of 4,000 steps per day. Recommend Mediterranean diet. We discussed this. For DM2, goal A1c < 7, A1c 6.7 mg/dL is in a good range. Considering she is asymptomatic, no further cardiac w/u is recommended at this time. Will work on lowering her LDL PLAN:    In order of problems listed above:  Zetia 10 mg Lipids in 3 months Follow up as needed; if not at goal, will send to pharmacy for leqvio           Medication Adjustments/Labs and Tests Ordered: Current medicines are reviewed at length with the patient today.  Concerns regarding medicines are outlined above.  Orders Placed This Encounter  Procedures   Lipid panel   EKG 12-Lead   Meds ordered this encounter  Medications   ezetimibe (ZETIA) 10 MG tablet    Sig: Take 1 tablet (10 mg total) by mouth daily.    Dispense:  90 tablet    Refill:  3    Patient Instructions  Medication Instructions:  START ZETIA 10 MG take 1 tablet daily  *If you need a refill on your cardiac medications before your next appointment, please call your pharmacy*  Lab Work: Please return for Blood Work in 3 months FASTING . No appointment needed, lab here at the office is open Monday-Friday from 8AM to 4PM and closed daily  for lunch from 12:45-1:45.   If you have labs (blood work) drawn today and your tests are completely normal, you will receive your results only by: MyChart Message (if you have MyChart) OR A paper copy in the mail If you have any lab test that is abnormal or we need to change your treatment, we will call you to review the results.  Follow-Up: At [facility], you and your health needs are our priority.  As part of our continuing mission to provide you with exceptional heart care, we have created designated Provider Care Teams.  These Care  Teams include your primary Cardiologist (physician) and Advanced Practice Providers (APPs -  Physician Assistants and Nurse Practitioners) who all work together to provide you with the care you need, when you need it.  We recommend signing up for the patient portal called "MyChart".  Sign up information is provided on this After Visit Summary.  MyChart is used to connect with patients for Virtual Visits (Telemedicine).  Patients are able to view lab/test results, encounter notes, upcoming appointments, etc.  Non-urgent messages can be sent to your provider as well.   To learn more about what you can do with MyChart, go to [email or web address].    Your next appointment:   AS NEEDED  The format for your next appointment:   In Person  Provider:   Maisie Fus, MD {         Signed, Maisie Fus, MD  06/15/2022 1:54 PM    [facility]

## 2022-06-15 NOTE — Patient Instructions (Addendum)
Medication Instructions:  START ZETIA 10 MG take 1 tablet daily  *If you need a refill on your cardiac medications before your next appointment, please call your pharmacy*  Lab Work: Please return for Blood Work in 3 months FASTING . No appointment needed, lab here at the office is open Monday-Friday from 8AM to 4PM and closed daily for lunch from 12:45-1:45.   If you have labs (blood work) drawn today and your tests are completely normal, you will receive your results only by: MyChart Message (if you have MyChart) OR A paper copy in the mail If you have any lab test that is abnormal or we need to change your treatment, we will call you to review the results.  Follow-Up: At [facility], you and your health needs are our priority.  As part of our continuing mission to provide you with exceptional heart care, we have created designated Provider Care Teams.  These Care Teams include your primary Cardiologist (physician) and Advanced Practice Providers (APPs -  Physician Assistants and Nurse Practitioners) who all work together to provide you with the care you need, when you need it.  We recommend signing up for the patient portal called "MyChart".  Sign up information is provided on this After Visit Summary.  MyChart is used to connect with patients for Virtual Visits (Telemedicine).  Patients are able to view lab/test results, encounter notes, upcoming appointments, etc.  Non-urgent messages can be sent to your provider as well.   To learn more about what you can do with MyChart, go to [email or web address].    Your next appointment:   AS NEEDED  The format for your next appointment:   In Person  Provider:   Maisie Fus, MD {

## 2022-06-17 DIAGNOSIS — I70203 Unspecified atherosclerosis of native arteries of extremities, bilateral legs: Secondary | ICD-10-CM | POA: Diagnosis not present

## 2022-06-17 DIAGNOSIS — B351 Tinea unguium: Secondary | ICD-10-CM | POA: Diagnosis not present

## 2022-07-22 DIAGNOSIS — G4733 Obstructive sleep apnea (adult) (pediatric): Secondary | ICD-10-CM | POA: Diagnosis not present

## 2022-08-21 DIAGNOSIS — G4733 Obstructive sleep apnea (adult) (pediatric): Secondary | ICD-10-CM | POA: Diagnosis not present

## 2022-08-24 ENCOUNTER — Encounter: Payer: Self-pay | Admitting: Neurology

## 2022-08-24 ENCOUNTER — Ambulatory Visit (INDEPENDENT_AMBULATORY_CARE_PROVIDER_SITE_OTHER): Payer: Managed Care, Other (non HMO) | Admitting: Neurology

## 2022-08-24 ENCOUNTER — Telehealth: Payer: Self-pay | Admitting: *Deleted

## 2022-08-24 VITALS — BP 114/75 | HR 82 | Ht 65.0 in | Wt 253.6 lb

## 2022-08-24 DIAGNOSIS — R140 Abdominal distension (gaseous): Secondary | ICD-10-CM | POA: Diagnosis not present

## 2022-08-24 DIAGNOSIS — G4733 Obstructive sleep apnea (adult) (pediatric): Secondary | ICD-10-CM | POA: Diagnosis not present

## 2022-08-24 DIAGNOSIS — R251 Tremor, unspecified: Secondary | ICD-10-CM

## 2022-08-24 NOTE — Patient Instructions (Signed)
Please continue using your CPAP, I will review the compliance download and make suggestions for adjustment of the pressure, please talk to your DME provider about trying a nasal mask which may reduce your bloating.  Please follow-up to see one of our nurse practitioners in 1 year, we can continue to monitor your tremor.

## 2022-08-24 NOTE — Telephone Encounter (Signed)
LVM informed patient she would have to bring SD card or CPAP machine and powercord here to office or local [facility] to get a DL report for Dr Rexene Alberts

## 2022-08-24 NOTE — Progress Notes (Addendum)
Subjective:    Patient ID: Diane Jordan is a 71 y.o. female.  HPI   Interim history:   Diane Jordan is a 71 year old right-handed woman with an underlying medical history of hypertension, diabetes, hyperlipidemia, arthritis, allergies, and severe obesity with a BMI of over 40, who presents for follow-up consultation of her OSA on autoPAP and intermittent tremors. The patient is unaccompanied today.  I last saw her on 08/26/2021, at which time she reported doing fairly well with her CPAP, she was compliant with treatment.  I suggested we increase her treatment pressure to 8 cm.  She felt that her tremors were better as well.  She was sleeping better.  She was advised to follow-up routinely in 1 year.  Today, 08/24/22: She reports that she has had significant leaking from her CPAP, she uses a fullface mask.  A download was not possible today, we will get in touch with her DME provider.  She has a reported home but forgot to bring it.  She can try to scan it or take a picture and send it through my chart, she is agreeable.  She is motivated to continue with treatment, she has been taking Pepcid for reflux but it does not help with the bloating.  Tremor is stable, she only notices tremor when she is doing something fine motor with her hands, especially using her phone and using the thumb.  She tries to hydrate well.  She limits her caffeine.  Addendum, 03/14/2023: patient compliant with CPAP of 7 cm, but suboptimal AHI. Will increase pressure to 8cm.   The patient's allergies, current medications, family history, past medical history, past social history, past surgical history and problem list were reviewed and updated as appropriate.   Previously:    I reviewed her CPAP compliance data from 06/17/2021 through 07/16/2021, which is a total of 30 days, during which time she used her machine 27 days with percent used days greater than 4 hours at 87%, indicating very good compliance with an average usage of  7.2 hours for days on treatment, residual slightly elevated at 7.5/h, leak acceptable and sometimes higher, with a 95th percentile at 19.5 L/min, pressure at 7 cm.    I first met her at the request of her endocrinologist, Dr. Talmage Nap on 12/11/2020, at which time the patient reported history of hand tremors for the past 3 years.  We talked about tremor management and tremor triggers.  She did complain of sleep disturbances including snoring and nonrestorative sleep.  She was advised to proceed with labs and sleep study.  TSH was normal at the time.  She had a baseline sleep study, followed by a CPAP titration study Baseline sleep study from 01/02/2021 showed a sleep latency of 68.5 minutes, REM latency 108.5 minutes, sleep efficiency 70.5%.  She had an increased percentage of stage I and stage II sleep, she had 16.2% of slow-wave sleep and REM sleep was 8.2%.  She had a total AHI of 25.6/h, REM AHI was 30/h, supine sleep was practically not achieved.  She had no significant PLM's.  She was advised to return for a titration study.  She had a CPAP titration on 02/13/2021.  Sleep efficiency was 92.9%, sleep latency 25.5 minutes and REM latency 67.5 minutes.  She had an increased percentage of stage II sleep, she had a normal percentage of slow-wave sleep at 19.7% and REM sleep was reduced at 11.1%.  She was fitted with a nasal mask and titrated on CPAP from 5  cm to 10 cm.  On pressures above 7 cm she had central apneas.  Her AHI on 7 cm was 8.8/h with no central apneas noted, O2 nadir was 91% with brief supine REM sleep achieved.  I suggested a home treatment pressure at 7 cm due to treatment emergent central apneas noted during sleep study.  Her set up date was 06/09/2021.  She has an [id number].    12/11/20: (She) reports a history of hand tremors for maybe 3 years.  They are intermittent, not very bothersome, mostly in both hands and noticeable with handwriting or when she points towards something.  Generally speaking,  she is not impaired in her day-to-day functioning.  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  She does not drink any caffeine.  She initially denied any family history of tremors but upon further asking, she does report a hand tremor in her sister who has dementia.  She has no family history of Parkinson's disease.  She has 3 grown children, none with tremor issues.  She lives with her husband.  Of note, she does not always sleep very well.  She has nocturia once per night, she occasionally wakes up with a headache and snoring has been noted by her husband who has encouraged her to get checked out for this.  He has a CPAP machine so she is familiar with the diagnosis of obstructive sleep apnea.   I reviewed your office note from 10/15/2020.  She had blood work on 10/08/2020, BUN was 23, glucose 86, creatinine elevated at 1.3, cholesterol 206, HDL 70, LDL 113, triglycerides 115, A1c on 10/15/2020 was 6.1.  She also had a vitamin B12 level and vitamin D level checked which reportedly were normal on 10/15/2020.  B12 was 541, vitamin D 37.8.    She tries to hydrate well with water.  She denies any other neurological symptoms.   Of note, she had a brain MRI without contrast on 04/17/2013 and I reviewed the results:  IMPRESSION:  No acute abnormality.          Her Past Medical History Is Significant For: Past Medical History:  Diagnosis Date   Arthritis    DM (diabetes mellitus) (HCC)    HTN (hypertension)    Postmenopausal     Her Past Surgical History Is Significant For: Past Surgical History:  Procedure Laterality Date   TUBAL LIGATION     1982    Her Family History Is Significant For: Family History  Problem Relation Age of Onset   Pulmonary fibrosis Mother    Heart disease Mother    High blood pressure Father    Diabetes Father    Sleep apnea Neg Hx     Her Social History Is Significant For: Social History   Socioeconomic History   Marital status: Married     Spouse name: Not on file   Number of children: 3   Years of education: Not on file   Highest education level: Not on file  Occupational History    Employer: [employer]  Tobacco Use   Smoking status: Never   Smokeless tobacco: Never  Vaping Use   Vaping Use: Never used  Substance and Sexual Activity   Alcohol use: No   Drug use: No   Sexual activity: Yes    Partners: Male    Birth control/protection: Pill, Surgical, Post-menopausal  Other Topics Concern   Not on file  Social History Narrative   Not on file  Social Determinants of Health   Financial Resource Strain: Not on file  Food Insecurity: Not on file  Transportation Needs: Not on file  Physical Activity: Not on file  Stress: Not on file  Social Connections: Not on file    Her Allergies Are:  Allergies  Allergen Reactions   Capsules, Empty Gelatin [Gelatin]     Any capsule form med causes itching.    Fluticasone Propionate Itching   Prinivil [Lisinopril] Itching  :   Her Current Medications Are:  Outpatient Encounter Medications as of 08/24/2022  Medication Sig   aspirin 81 MG EC tablet 1 tablet   chlorthalidone (HYGROTON) 25 MG tablet Take 25 mg by mouth every morning.   ezetimibe (ZETIA) 10 MG tablet Take 1 tablet (10 mg total) by mouth daily.   famotidine (PEPCID) 20 MG tablet famotidine 20 mg tablet  TAKE 1 TABLET BY MOUTH TWICE A DAY   fexofenadine (ALLEGRA) 30 MG tablet Take 30 mg by mouth daily.   hydrOXYzine (ATARAX/VISTARIL) 25 MG tablet Take 25 mg by mouth as needed.   Insulin Lispro Prot & Lispro (HUMALOG MIX 75/25 KWIKPEN) (75-25) 100 UNIT/ML Kwikpen    irbesartan (AVAPRO) 150 MG tablet Take 150 mg by mouth daily.   JARDIANCE 25 MG TABS tablet Take 25 mg by mouth daily.   meclizine (ANTIVERT) 25 MG tablet Take 1 tablet (25 mg total) by mouth 3 (three) times daily as needed for dizziness.   Multiple Vitamin (MULTIVITAMIN WITH MINERALS) TABS Take 1 tablet by mouth daily.   insulin aspart  protamine - aspart (NOVOLOG MIX 70/30 FLEXPEN) (70-30) 100 UNIT/ML FlexPen 40am/35upm   metFORMIN (GLUCOPHAGE) 500 MG tablet Take 500 mg by mouth 2 (two) times daily with a meal. (Patient not taking: Reported on 06/15/2022)   ondansetron (ZOFRAN-ODT) 4 MG disintegrating tablet Take 1 tablet (4 mg total) by mouth every 8 (eight) hours as needed for nausea or vomiting. (Patient not taking: Reported on 06/15/2022)   No facility-administered encounter medications on file as of 08/24/2022.  :  Review of Systems:  Out of a complete 14 point review of systems, all are reviewed and negative with the exception of these symptoms as listed below:  Review of Systems  Neurological:        Pt here for CPAP f/u Pt states she has terrible gas  Informed patient may be air pressure Pt states gets indention's  on face in am     ESS:6    Objective:  Neurological Exam  Physical Exam Physical Examination:   Vitals:   08/24/22 1356  BP: 114/75  Pulse: 82    General Examination: The patient is a very pleasant 71 y.o. female in no acute distress. She appears well-developed and well groomed.   HEENT: Normocephalic, atraumatic, pupils are equal, round and reactive to light and accommodation. Extraocular tracking is good without limitation to gaze excursion or nystagmus noted. Normal smooth pursuit is noted. Hearing is grossly intact. Face is symmetric with normal facial animation and normal facial sensation. Speech is clear with no dysarthria noted. There is no hypophonia. There is no lip, neck/head, jaw or voice tremor. Neck with FROM. There are no carotid bruits on auscultation. Oropharynx exam reveals: mild mouth dryness, adequate dental hygiene and moderate airway crowding. Tongue protrudes centrally and palate elevates symmetrically.     Chest: Clear to auscultation without wheezing, rhonchi or crackles noted.   Heart: S1+S2+0, regular and normal without murmurs, rubs or gallops noted.    Abdomen:  Soft, non-tender and non-distended.   Extremities: There is trace edema both ankles.    Skin: Warm and dry without trophic changes noted.   Musculoskeletal: exam reveals no obvious joint deformities.    Neurologically:  Mental status: The patient is awake, alert and oriented in all 4 spheres. Her immediate and remote memory, attention, language skills and fund of knowledge are appropriate. There is no evidence of aphasia, agnosia, apraxia or anomia. Speech is clear with normal prosody and enunciation. Thought process is linear. Mood is normal and affect is normal.  Cranial nerves II - XII are as described above under HEENT exam.  Motor exam: Normal bulk, strength and tone is noted. There is no drift, resting tremor or rebound.    (On 12/11/2020: On Archimedes spiral drawing she has slight trembling with the left and right hand, handwriting with the right hand is slightly tremulous, legible, not micrographic.)    She does not have any postural or action tremor, no intention tremor.  No lower extremity tremor noted.  No resting tremor. Fine motor skills and coordination: intact grossly.   Cerebellar testing: No dysmetria or intention tremor. There is no truncal or gait ataxia.  Sensory exam: intact to light touch in the upper and lower extremities.    Gait, station and balance: She stands easily. No veering to one side is noted. No leaning to one side is noted. Posture is age-appropriate and stance is narrow based. Gait shows normal stride length and normal pace. No problems turning are noted.    Assessment and Plan:    In summary, QUINETTA WESTLY is a 71 year old female with an underlying medical history of hypertension, diabetes, hyperlipidemia, arthritis, allergies, and severe obesity with a BMI of over 40, who presents for follow-up consultation of her OSA and intermittent hand tremor. Her OSA was deemed in the moderate range by baseline sleep testing in March 2022.  She had a subsequent  titration study in April 2022 and has treatment with CPAP therapy since 06/09/2021.  She reports compliance with treatment and also ongoing good results but has had difficulty with bloating, probably aerophagia, is using a fullface mask.  We increased her pressure last year to 8 cm.  I will review her download and make adjustments as needed.  She is encouraged to talk to her DME provider about potentially utilizing a nasal mask rather than a full face mask.  She is commended for her treatment adherence.  She is motivated to continue with treatment.  She has not had any significant flareup in her tremors and we will continue to monitor.  At this juncture, she is advised to follow-up routinely in sleep clinic to see one of our nurse practitioners in 1 year.  She is encouraged to call us or email [place] through MyChart with any interim questions or concerns.  I answered all her questions today and she was in agreement.   I spent 20 minutes in total face-to-face time and in reviewing records during pre-charting, more than 50% of which was spent in counseling and coordination of care, reviewing test results, reviewing medications and treatment regimen and/or in discussing or reviewing the diagnosis of OSA, intermittent tremor, the prognosis and treatment options. Pertinent laboratory and imaging test results that were available during this visit with the patient were reviewed by me and considered in my medical decision making (see chart for details).

## 2022-08-26 ENCOUNTER — Encounter: Payer: Self-pay | Admitting: Neurology

## 2022-08-26 ENCOUNTER — Ambulatory Visit: Payer: Medicare Other | Admitting: Neurology

## 2022-09-15 LAB — LIPID PANEL
Chol/HDL Ratio: 2.8 ratio (ref 0.0–4.4)
Cholesterol, Total: 178 mg/dL (ref 100–199)
HDL: 63 mg/dL (ref >39)
LDL Chol Calc (NIH): 97 mg/dL (ref 0–99)
Triglycerides: 99 mg/dL (ref 0–149)
VLDL Cholesterol Cal: 18 mg/dL (ref 5–40)

## 2022-09-21 DIAGNOSIS — G4733 Obstructive sleep apnea (adult) (pediatric): Secondary | ICD-10-CM | POA: Diagnosis not present

## 2022-10-12 DIAGNOSIS — E139 Other specified diabetes mellitus without complications: Secondary | ICD-10-CM | POA: Diagnosis not present

## 2022-11-19 DIAGNOSIS — N1831 Chronic kidney disease, stage 3a: Secondary | ICD-10-CM | POA: Diagnosis not present

## 2022-12-01 DIAGNOSIS — L309 Dermatitis, unspecified: Secondary | ICD-10-CM | POA: Diagnosis not present

## 2022-12-01 DIAGNOSIS — I10 Essential (primary) hypertension: Secondary | ICD-10-CM | POA: Diagnosis not present

## 2022-12-01 DIAGNOSIS — R21 Rash and other nonspecific skin eruption: Secondary | ICD-10-CM | POA: Diagnosis not present

## 2022-12-07 DIAGNOSIS — G4733 Obstructive sleep apnea (adult) (pediatric): Secondary | ICD-10-CM | POA: Diagnosis not present

## 2022-12-20 DIAGNOSIS — E1165 Type 2 diabetes mellitus with hyperglycemia: Secondary | ICD-10-CM | POA: Diagnosis not present

## 2022-12-20 DIAGNOSIS — E7800 Pure hypercholesterolemia, unspecified: Secondary | ICD-10-CM | POA: Diagnosis not present

## 2022-12-22 DIAGNOSIS — E139 Other specified diabetes mellitus without complications: Secondary | ICD-10-CM | POA: Diagnosis not present

## 2022-12-27 DIAGNOSIS — I10 Essential (primary) hypertension: Secondary | ICD-10-CM | POA: Diagnosis not present

## 2022-12-27 DIAGNOSIS — Z789 Other specified health status: Secondary | ICD-10-CM | POA: Diagnosis not present

## 2022-12-27 DIAGNOSIS — Z800 Family history of malignant neoplasm of digestive organs: Secondary | ICD-10-CM | POA: Diagnosis not present

## 2022-12-27 DIAGNOSIS — I700 Atherosclerosis of aorta: Secondary | ICD-10-CM | POA: Diagnosis not present

## 2022-12-27 DIAGNOSIS — R202 Paresthesia of skin: Secondary | ICD-10-CM | POA: Diagnosis not present

## 2022-12-27 DIAGNOSIS — E1165 Type 2 diabetes mellitus with hyperglycemia: Secondary | ICD-10-CM | POA: Diagnosis not present

## 2022-12-27 DIAGNOSIS — E7800 Pure hypercholesterolemia, unspecified: Secondary | ICD-10-CM | POA: Diagnosis not present

## 2023-01-03 DIAGNOSIS — R21 Rash and other nonspecific skin eruption: Secondary | ICD-10-CM | POA: Diagnosis not present

## 2023-01-05 DIAGNOSIS — G4733 Obstructive sleep apnea (adult) (pediatric): Secondary | ICD-10-CM | POA: Diagnosis not present

## 2023-01-25 DIAGNOSIS — E1165 Type 2 diabetes mellitus with hyperglycemia: Secondary | ICD-10-CM | POA: Diagnosis not present

## 2023-02-01 DIAGNOSIS — Z800 Family history of malignant neoplasm of digestive organs: Secondary | ICD-10-CM | POA: Diagnosis not present

## 2023-02-01 DIAGNOSIS — I10 Essential (primary) hypertension: Secondary | ICD-10-CM | POA: Diagnosis not present

## 2023-02-01 DIAGNOSIS — E7800 Pure hypercholesterolemia, unspecified: Secondary | ICD-10-CM | POA: Diagnosis not present

## 2023-02-01 DIAGNOSIS — R202 Paresthesia of skin: Secondary | ICD-10-CM | POA: Diagnosis not present

## 2023-02-01 DIAGNOSIS — E1165 Type 2 diabetes mellitus with hyperglycemia: Secondary | ICD-10-CM | POA: Diagnosis not present

## 2023-02-01 DIAGNOSIS — Z789 Other specified health status: Secondary | ICD-10-CM | POA: Diagnosis not present

## 2023-02-01 DIAGNOSIS — I700 Atherosclerosis of aorta: Secondary | ICD-10-CM | POA: Diagnosis not present

## 2023-02-05 DIAGNOSIS — G4733 Obstructive sleep apnea (adult) (pediatric): Secondary | ICD-10-CM | POA: Diagnosis not present

## 2023-02-21 DIAGNOSIS — E139 Other specified diabetes mellitus without complications: Secondary | ICD-10-CM | POA: Diagnosis not present

## 2023-03-11 ENCOUNTER — Telehealth: Payer: Self-pay | Admitting: *Deleted

## 2023-03-11 NOTE — Telephone Encounter (Signed)
Pt walked into [facility] office on Friday 03/11/23 with cpap machine for data download. Report listed below:  If any replies please route to pod 4.

## 2023-03-14 NOTE — Telephone Encounter (Signed)
Sent community message to [facility] this evening to increase pressure

## 2023-03-14 NOTE — Telephone Encounter (Signed)
I have ordered a pressure increase to 8 cm.

## 2023-03-14 NOTE — Addendum Note (Signed)
Addended by: Huston Foley on: 03/14/2023 05:01 PM   Modules accepted: Orders

## 2023-04-13 DIAGNOSIS — G4733 Obstructive sleep apnea (adult) (pediatric): Secondary | ICD-10-CM | POA: Diagnosis not present

## 2023-04-17 ENCOUNTER — Other Ambulatory Visit: Payer: Self-pay | Admitting: Internal Medicine

## 2023-04-25 DIAGNOSIS — E139 Other specified diabetes mellitus without complications: Secondary | ICD-10-CM | POA: Diagnosis not present

## 2023-05-13 DIAGNOSIS — G4733 Obstructive sleep apnea (adult) (pediatric): Secondary | ICD-10-CM | POA: Diagnosis not present

## 2023-05-25 DIAGNOSIS — I10 Essential (primary) hypertension: Secondary | ICD-10-CM | POA: Diagnosis not present

## 2023-05-25 DIAGNOSIS — Z79899 Other long term (current) drug therapy: Secondary | ICD-10-CM | POA: Diagnosis not present

## 2023-05-25 DIAGNOSIS — I700 Atherosclerosis of aorta: Secondary | ICD-10-CM | POA: Diagnosis not present

## 2023-05-25 DIAGNOSIS — N1831 Chronic kidney disease, stage 3a: Secondary | ICD-10-CM | POA: Diagnosis not present

## 2023-05-25 DIAGNOSIS — E7800 Pure hypercholesterolemia, unspecified: Secondary | ICD-10-CM | POA: Diagnosis not present

## 2023-05-25 DIAGNOSIS — Z0001 Encounter for general adult medical examination with abnormal findings: Secondary | ICD-10-CM | POA: Diagnosis not present

## 2023-05-25 DIAGNOSIS — I2510 Atherosclerotic heart disease of native coronary artery without angina pectoris: Secondary | ICD-10-CM | POA: Diagnosis not present

## 2023-05-25 DIAGNOSIS — R109 Unspecified abdominal pain: Secondary | ICD-10-CM | POA: Diagnosis not present

## 2023-05-25 DIAGNOSIS — M5430 Sciatica, unspecified side: Secondary | ICD-10-CM | POA: Diagnosis not present

## 2023-05-25 DIAGNOSIS — E1169 Type 2 diabetes mellitus with other specified complication: Secondary | ICD-10-CM | POA: Diagnosis not present

## 2023-05-31 ENCOUNTER — Other Ambulatory Visit: Payer: Self-pay | Admitting: Family Medicine

## 2023-05-31 ENCOUNTER — Ambulatory Visit
Admission: RE | Admit: 2023-05-31 | Discharge: 2023-05-31 | Disposition: A | Discharge status: Home / Self Care | Payer: Managed Care, Other (non HMO) | Source: Ambulatory Visit | Attending: Family Medicine | Admitting: Family Medicine

## 2023-05-31 DIAGNOSIS — R109 Unspecified abdominal pain: Secondary | ICD-10-CM

## 2023-05-31 DIAGNOSIS — R10A0 Flank pain, unspecified side: Secondary | ICD-10-CM

## 2023-05-31 DIAGNOSIS — M5136 Other intervertebral disc degeneration, lumbar region: Secondary | ICD-10-CM | POA: Diagnosis not present

## 2023-05-31 DIAGNOSIS — M5430 Sciatica, unspecified side: Secondary | ICD-10-CM

## 2023-05-31 DIAGNOSIS — K5900 Constipation, unspecified: Secondary | ICD-10-CM | POA: Diagnosis not present

## 2023-05-31 DIAGNOSIS — M4316 Spondylolisthesis, lumbar region: Secondary | ICD-10-CM | POA: Diagnosis not present

## 2023-06-06 DIAGNOSIS — Z1231 Encounter for screening mammogram for malignant neoplasm of breast: Secondary | ICD-10-CM | POA: Diagnosis not present

## 2023-06-07 ENCOUNTER — Other Ambulatory Visit: Payer: Self-pay | Admitting: Obstetrics and Gynecology

## 2023-06-07 DIAGNOSIS — Z789 Other specified health status: Secondary | ICD-10-CM | POA: Diagnosis not present

## 2023-06-07 DIAGNOSIS — Z800 Family history of malignant neoplasm of digestive organs: Secondary | ICD-10-CM | POA: Diagnosis not present

## 2023-06-07 DIAGNOSIS — I10 Essential (primary) hypertension: Secondary | ICD-10-CM | POA: Diagnosis not present

## 2023-06-07 DIAGNOSIS — I700 Atherosclerosis of aorta: Secondary | ICD-10-CM | POA: Diagnosis not present

## 2023-06-07 DIAGNOSIS — E1165 Type 2 diabetes mellitus with hyperglycemia: Secondary | ICD-10-CM | POA: Diagnosis not present

## 2023-06-07 DIAGNOSIS — E7800 Pure hypercholesterolemia, unspecified: Secondary | ICD-10-CM | POA: Diagnosis not present

## 2023-06-07 DIAGNOSIS — R202 Paresthesia of skin: Secondary | ICD-10-CM | POA: Diagnosis not present

## 2023-06-13 DIAGNOSIS — G4733 Obstructive sleep apnea (adult) (pediatric): Secondary | ICD-10-CM | POA: Diagnosis not present

## 2023-07-06 DIAGNOSIS — G4733 Obstructive sleep apnea (adult) (pediatric): Secondary | ICD-10-CM | POA: Diagnosis not present

## 2023-07-24 ENCOUNTER — Inpatient Hospital Stay
Admission: RE | Admit: 2023-07-24 | Discharge: 2023-07-24 | Discharge status: Home / Self Care | Payer: Managed Care, Other (non HMO) | Source: Ambulatory Visit | Attending: Obstetrics and Gynecology | Admitting: Obstetrics and Gynecology

## 2023-07-24 DIAGNOSIS — Z800 Family history of malignant neoplasm of digestive organs: Secondary | ICD-10-CM

## 2023-07-24 DIAGNOSIS — K8020 Calculus of gallbladder without cholecystitis without obstruction: Secondary | ICD-10-CM | POA: Diagnosis not present

## 2023-07-24 DIAGNOSIS — K449 Diaphragmatic hernia without obstruction or gangrene: Secondary | ICD-10-CM | POA: Diagnosis not present

## 2023-07-24 MED ORDER — GADOPICLENOL 0.5 MMOL/ML IV SOLN
10.0000 mL | Freq: Once | INTRAVENOUS | Status: AC | PRN
  Administered 2023-07-24: 10 mL via INTRAVENOUS

## 2023-07-26 DIAGNOSIS — E139 Other specified diabetes mellitus without complications: Secondary | ICD-10-CM | POA: Diagnosis not present

## 2023-07-28 DIAGNOSIS — T63421A Toxic effect of venom of ants, accidental (unintentional), initial encounter: Secondary | ICD-10-CM | POA: Diagnosis not present

## 2023-07-28 DIAGNOSIS — S90861A Insect bite (nonvenomous), right foot, initial encounter: Secondary | ICD-10-CM | POA: Diagnosis not present

## 2023-07-28 DIAGNOSIS — S90862A Insect bite (nonvenomous), left foot, initial encounter: Secondary | ICD-10-CM | POA: Diagnosis not present

## 2023-08-05 DIAGNOSIS — G4733 Obstructive sleep apnea (adult) (pediatric): Secondary | ICD-10-CM | POA: Diagnosis not present

## 2023-08-10 DIAGNOSIS — K625 Hemorrhage of anus and rectum: Secondary | ICD-10-CM | POA: Diagnosis not present

## 2023-08-10 DIAGNOSIS — K219 Gastro-esophageal reflux disease without esophagitis: Secondary | ICD-10-CM | POA: Diagnosis not present

## 2023-08-25 ENCOUNTER — Ambulatory Visit: Payer: Managed Care, Other (non HMO) | Admitting: Adult Health

## 2023-08-25 ENCOUNTER — Encounter: Payer: Self-pay | Admitting: Adult Health

## 2023-08-25 VITALS — BP 117/70 | HR 79 | Ht 65.0 in | Wt 234.0 lb

## 2023-08-25 DIAGNOSIS — G4733 Obstructive sleep apnea (adult) (pediatric): Secondary | ICD-10-CM | POA: Diagnosis not present

## 2023-08-25 DIAGNOSIS — R251 Tremor, unspecified: Secondary | ICD-10-CM

## 2023-08-25 NOTE — Progress Notes (Signed)
[facility] [address] [place]. [place] [address] [phone]       OFFICE FOLLOW UP NOTE  [name] Date of Birth:  May 22, 1951 Medical Record Number:  [id number]    Primary neurologist: Dr. Frances Furbish Reason for visit: CPAP and tremor follow-up    SUBJECTIVE:   CHIEF COMPLAINT:  Chief Complaint  Patient presents with   Follow-up    RM 3, here alone Pt is here following up on OSA with CPAP. Pt states she is doing well with CPAP.     Follow-up visit:  Prior visit: 08/24/2022 with Dr. Frances Furbish  Brief HPI:   Diane Jordan is a 72 y.o. female who is followed in office for OSA on CPAP and intermittent tremors.  She was initially evaluated by Dr. Frances Furbish in 2022 for tremor consult, noted snoring and nonrestorative sleep and concern of possible sleep apnea. Recommended continued monitoring of tremor, no treatment required. Sleep study 12/2020 showed moderate to severe sleep apnea and AutoPap initiated on 06/09/2021.   At prior visit, noted adequate compliance but suboptimal AHI on pressure of 7 therefore pressure increased to 8.     Interval history:  Compliance report over the past 30 days shows 30 out of 30 usage days and 29 days greater than 4 hours for 97% compliance.  Average usage 7.5 hours.  Residual AHI 5.7 on set pressure of 7 with iPR 2.  Leaks in the 95th percentile 0.0L/min.   She reports tolerating her CPAP well, use of FFM.  Continued benefit in regards to daytime fatigue and sleep quality. ESS 1/24. Has noticed some issues with thinning of her hair where the head straps are. She does note weight loss since prior visit, was started on Mounjaro in April and has lost about 20 lbs thus far, goal is to be <200 lbs. She does mention her machine will frequently request a "reset" which will require her to unplug the machine for at least 30 minutes and this usually occurs in the middle of the night.  Tremors stable without worsening. Typically present  with lack of sleep, at times can affect her writing.       ROS:   14 system review of systems performed and negative with exception of those listed in HPI  PMH:  Past Medical History:  Diagnosis Date   Arthritis    DM (diabetes mellitus) (HCC)    HTN (hypertension)    Postmenopausal     PSH:  Past Surgical History:  Procedure Laterality Date   TUBAL LIGATION     1982    Social History:  Social History   Socioeconomic History   Marital status: Married    Spouse name: Not on file   Number of children: 3   Years of education: Not on file   Highest education level: Not on file  Occupational History    Employer: [employer]  Tobacco Use   Smoking status: Never   Smokeless tobacco: Never  Vaping Use   Vaping status: Never Used  Substance and Sexual Activity   Alcohol use: No   Drug use: No   Sexual activity: Yes    Partners: Male    Birth control/protection: Pill, Surgical, Post-menopausal  Other Topics Concern   Not on file  Social History Narrative   Not on file   Social Determinants of Health   Financial Resource Strain: Not on file  Food Insecurity: Not on file  Transportation Needs: Not on file  Physical  Activity: Not on file  Stress: Not on file  Social Connections: Not on file  Intimate Partner Violence: Not on file    Family History:  Family History  Problem Relation Age of Onset   Pulmonary fibrosis Mother    Heart disease Mother    High blood pressure Father    Diabetes Father    Sleep apnea Neg Hx     Medications:   Current Outpatient Medications on File Prior to Visit  Medication Sig Dispense Refill   aspirin 81 MG EC tablet 1 tablet     chlorthalidone (HYGROTON) 25 MG tablet Take 25 mg by mouth every morning.     ezetimibe (ZETIA) 10 MG tablet TAKE 1 TABLET BY MOUTH EVERY DAY 90 tablet 3   famotidine (PEPCID) 20 MG tablet famotidine 20 mg tablet  TAKE 1 TABLET BY MOUTH TWICE A DAY     fexofenadine (ALLEGRA) 30 MG tablet  Take 30 mg by mouth daily.     hydrOXYzine (ATARAX/VISTARIL) 25 MG tablet Take 25 mg by mouth as needed.     Insulin Lispro Prot & Lispro (HUMALOG MIX 75/25 KWIKPEN) (75-25) 100 UNIT/ML Kwikpen 20 AM AND 20 PM     irbesartan (AVAPRO) 150 MG tablet Take 150 mg by mouth daily.     JARDIANCE 25 MG TABS tablet Take 25 mg by mouth daily.     meclizine (ANTIVERT) 25 MG tablet Take 1 tablet (25 mg total) by mouth 3 (three) times daily as needed for dizziness. 30 tablet 0   MOUNJARO 7.5 MG/0.5ML Pen      Multiple Vitamin (MULTIVITAMIN WITH MINERALS) TABS Take 1 tablet by mouth daily.     No current facility-administered medications on file prior to visit.    Allergies:   Allergies  Allergen Reactions   Capsules, Empty Gelatin [Gelatin]     Any capsule form med causes itching.    Fluticasone Propionate Itching   Prinivil [Lisinopril] Itching      OBJECTIVE:  Physical Exam  Vitals:   08/25/23 1506  BP: 117/70  Pulse: 79  Weight: 234 lb (106.1 kg)  Height: 5\' 5"  (1.651 m)   Body mass index is 38.94 kg/m. No results found.   General: well developed, well nourished, very pleasant elderly African-American female, seated, in no evident distress Head: head normocephalic and atraumatic.   Neck: supple with no carotid or supraclavicular bruits Cardiovascular: regular rate and rhythm, no murmurs Musculoskeletal: no deformity Skin:  no rash/petichiae Vascular:  Normal pulses all extremities   Neurologic Exam Mental Status: Awake and fully alert. Oriented to place and time. Recent and remote memory intact. Attention span, concentration and fund of knowledge appropriate. Mood and affect appropriate.  Cranial Nerves: Pupils equal, briskly reactive to light. Extraocular movements full without nystagmus. Visual fields full to confrontation. Hearing intact. Facial sensation intact. Face, tongue, palate moves normally and symmetrically.  Motor: Normal bulk and tone. Normal strength in all  tested extremity muscles. Mild bilateral intention tremor Gait and Station: Arises from chair without difficulty. Stance is normal. Gait demonstrates normal stride length and balance without use of AD. Tandem walk and heel toe without difficulty.  Reflexes: 1+ and symmetric. Toes downgoing.         ASSESSMENT/PLAN: Diane Jordan is a 72 y.o. year old female    OSA on CPAP : Compliance report shows satisfactory usage with residual AHI 5.6 on pressure setting of 7. Dr. Frances Furbish had previously requested pressure change to 8 at last 2  visits but does not appear this occurred. Previously residual AHI 6.6. Suspect current lower AHI due to weight loss. At this juncture, recommend continuing with current pressure of 7 as she continues to strive for further weight loss. Discussed considering repeat sleep study once at goal weight to see if apnea still present (about 50lb weight loss). Will request f/u with DME to discuss head strap concerns causing hair loss and see if any other options are available. Will notify DME regarding machine concerns consistently needing resets (see HPI).  Discussed continued nightly usage with ensuring greater than 4 hours nightly for optimal benefit and per insurance purposes.  Continue to follow with DME company for any needed supplies or CPAP related concerns Tremor: stable. Only present with fatigue or occasionally when writing. Discussed importance of adequate sleep duration. Discussed use of weighted pen for potential benefit.  Advised to call with any worsening symptoms.     Follow up in 1 year or call earlier if needed   CC:  PCP: Koirala, Dibas, MD     I spent 30 minutes of face-to-face and non-face-to-face time with patient.  This included previsit chart review, lab review, study review, order entry, electronic health record documentation, patient education and discussion regarding above diagnoses and treatment plan and answered all other questions to patient's  satisfaction  Ihor Austin, [facility]  [facility] [address] [place], [place] [address]  Phone [phone] Fax [phone] Note: This document was prepared with digital dictation and possible smart phrase technology. Any transcriptional errors that result from this process are unintentional.

## 2023-08-25 NOTE — Patient Instructions (Addendum)
Your Plan:  Continue nightly use of CPAP for sleep apnea management   You will be called by DME company [employer] to discuss other mask strap options  If you end up getting to your goal weight and want to repeat a sleep study to see if apnea is still present, please let me know!   Continue to follow with your DME company for any needed supplies or CPAP related concerns  You can try to use a weighted pen to help with tremor while writing - please let me know if your tremors should worsen    Follow up in 1 year or call earlier if needed     Thank you for coming to see [place] at [facility]. I hope we have been able to provide you high quality care today.  You may receive a patient satisfaction survey over the next few weeks. We would appreciate your feedback and comments so that we may continue to improve ourselves and the health of our patients.

## 2023-08-29 ENCOUNTER — Telehealth: Payer: Self-pay

## 2023-09-05 DIAGNOSIS — G4733 Obstructive sleep apnea (adult) (pediatric): Secondary | ICD-10-CM | POA: Diagnosis not present

## 2023-09-07 DIAGNOSIS — K625 Hemorrhage of anus and rectum: Secondary | ICD-10-CM | POA: Diagnosis not present

## 2023-10-12 DIAGNOSIS — G4733 Obstructive sleep apnea (adult) (pediatric): Secondary | ICD-10-CM | POA: Diagnosis not present

## 2023-10-17 DIAGNOSIS — Z800 Family history of malignant neoplasm of digestive organs: Secondary | ICD-10-CM | POA: Diagnosis not present

## 2023-10-17 DIAGNOSIS — E7800 Pure hypercholesterolemia, unspecified: Secondary | ICD-10-CM | POA: Diagnosis not present

## 2023-10-17 DIAGNOSIS — I700 Atherosclerosis of aorta: Secondary | ICD-10-CM | POA: Diagnosis not present

## 2023-10-17 DIAGNOSIS — R202 Paresthesia of skin: Secondary | ICD-10-CM | POA: Diagnosis not present

## 2023-10-17 DIAGNOSIS — E1165 Type 2 diabetes mellitus with hyperglycemia: Secondary | ICD-10-CM | POA: Diagnosis not present

## 2023-10-17 DIAGNOSIS — I10 Essential (primary) hypertension: Secondary | ICD-10-CM | POA: Diagnosis not present

## 2023-10-18 DIAGNOSIS — E139 Other specified diabetes mellitus without complications: Secondary | ICD-10-CM | POA: Diagnosis not present

## 2023-10-18 DIAGNOSIS — L84 Corns and callosities: Secondary | ICD-10-CM | POA: Diagnosis not present

## 2023-10-18 DIAGNOSIS — B351 Tinea unguium: Secondary | ICD-10-CM | POA: Diagnosis not present

## 2023-11-15 DIAGNOSIS — E139 Other specified diabetes mellitus without complications: Secondary | ICD-10-CM | POA: Diagnosis not present

## 2023-11-15 DIAGNOSIS — L84 Corns and callosities: Secondary | ICD-10-CM | POA: Diagnosis not present

## 2023-11-25 IMAGING — CT CT CARDIAC CORONARY ARTERY CALCIUM SCORE
3 series · 14 of 20 positions shown, 16 images · non-contrast
Comparison: None Available.

CLINICAL DATA: Diabetes, hyperglycemia.  Family history.

EXAM:
CT CARDIAC CORONARY ARTERY CALCIUM SCORE
TECHNIQUE: Non-contrast imaging through the heart was performed using
prospective ECG gating. Image post processing was performed on an
independent workstation, allowing for quantitative analysis of the
heart and coronary arteries. Note that this exam targets the heart
and the chest was not imaged in its entirety.

[Series 2: calcium scoring 2.00 qr36 bestdiast 71% hrt calciu · axial · 0.42mm/px · z∈[+1667,+1727]mm · 4 of 50 slices shown]
[im 10/50  vessel]
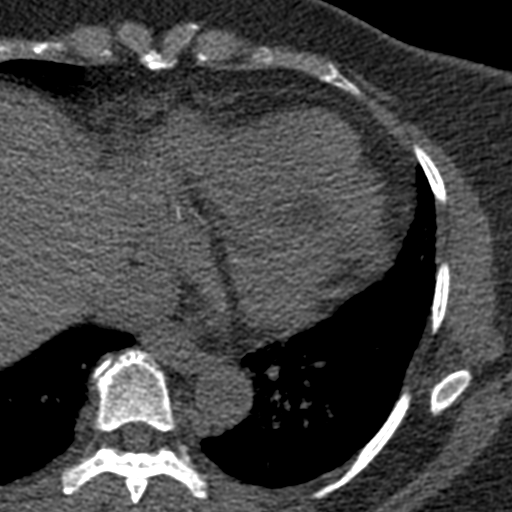
[im 20/50  vessel]
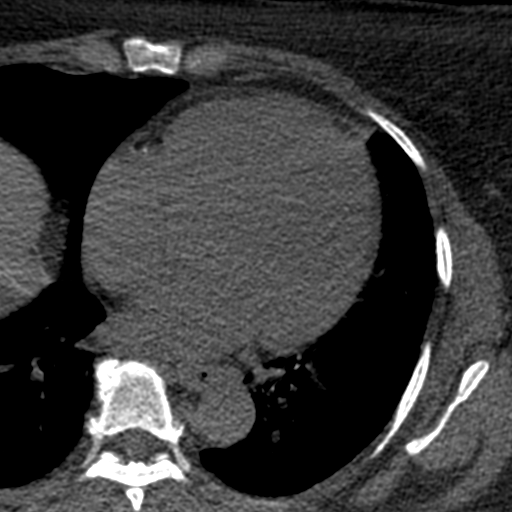
[im 30/50  vessel]
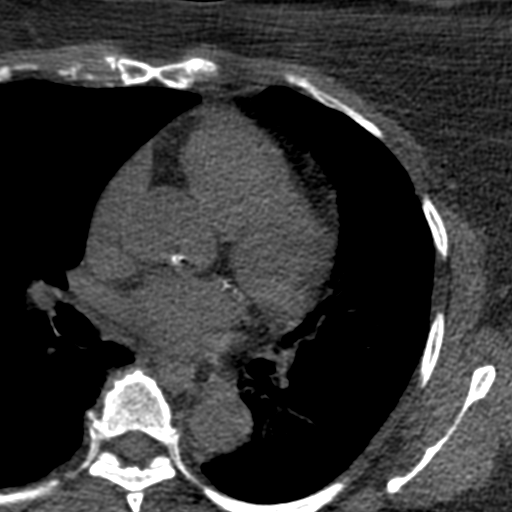
[im 40/50  vessel]
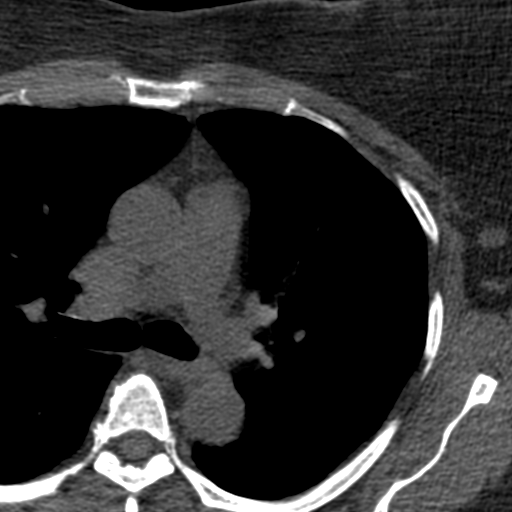

[Series 3: calcium scoring 2.00 br40 bestdiast 71% axial · axial · 0.63mm/px · z∈[+1665,+1729]mm · 5 of 50 slices shown, 7 images]
[im 9/50  vessel]
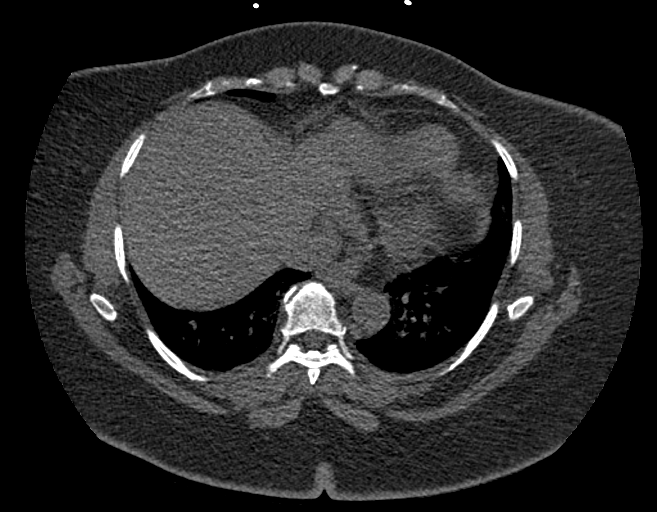
[im 9/50  lung]
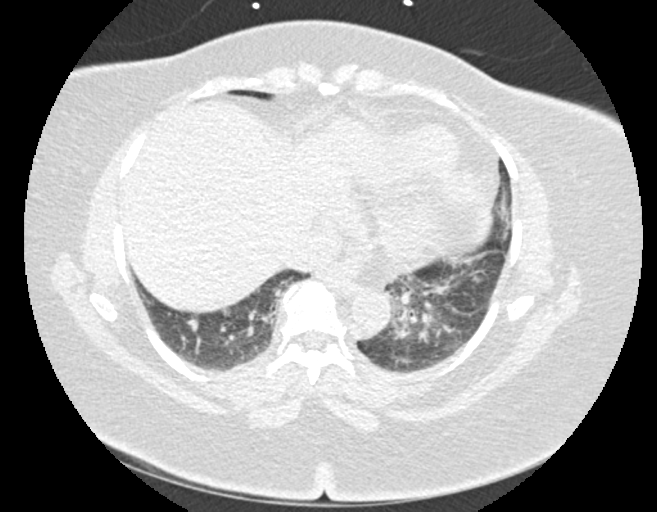
[im 17/50  vessel]
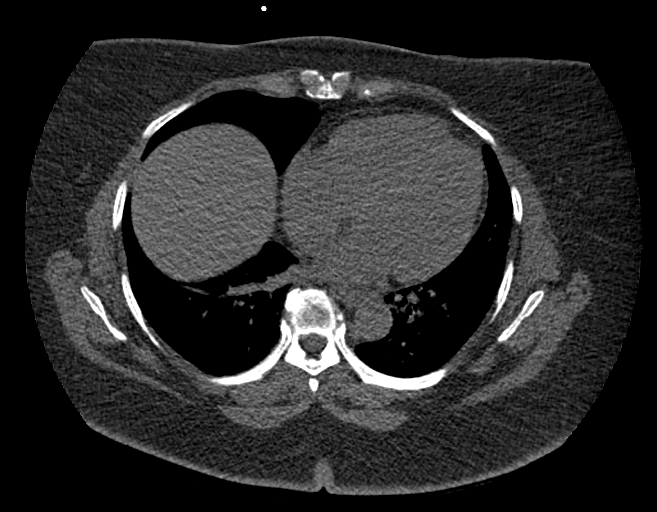
[im 25/50  vessel]
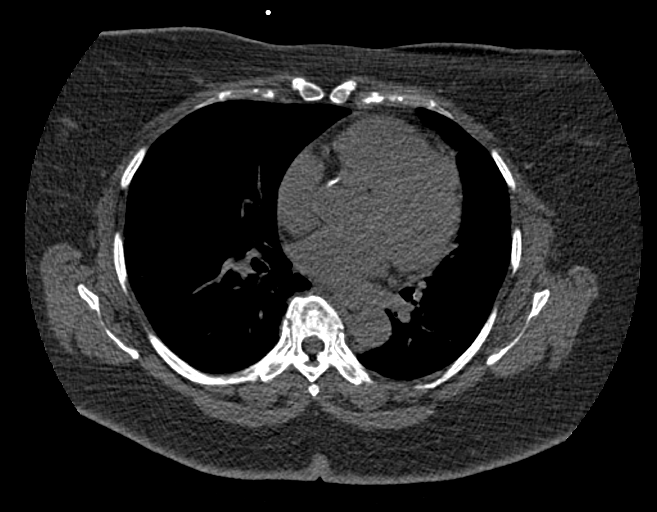
[im 33/50  vessel]
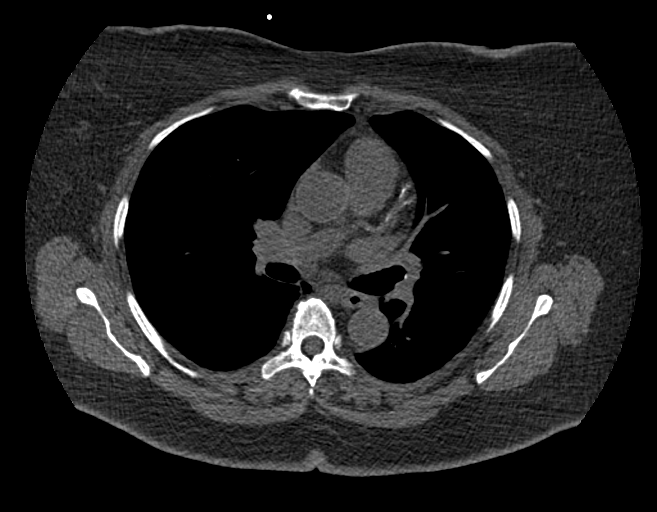
[im 41/50  vessel]
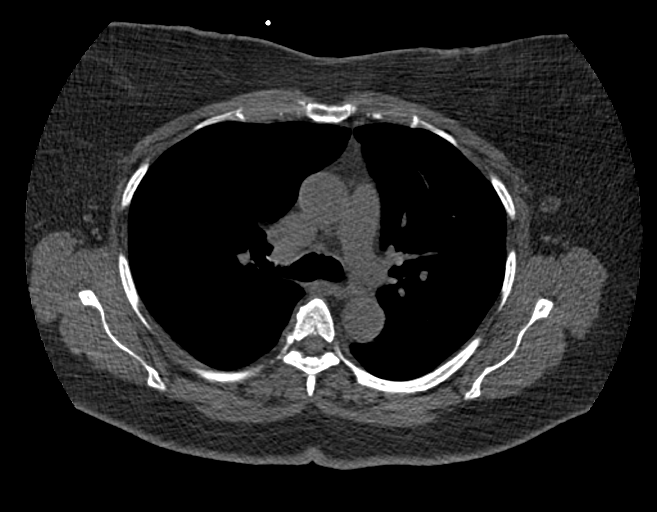
[im 41/50  lung]
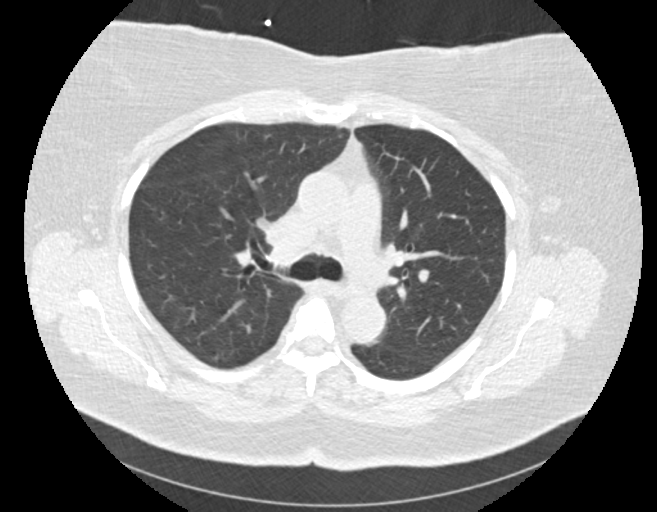

[Series 9: calcium scoring 2.00 br60 bestdiast 71% lungs · axial · 0.63mm/px · z∈[+1665,+1729]mm · 5 of 50 slices shown]
[im 9/50  vessel]
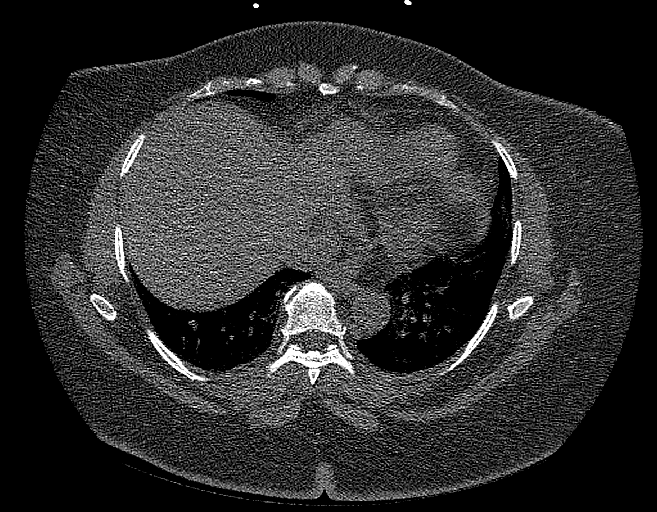
[im 17/50  vessel]
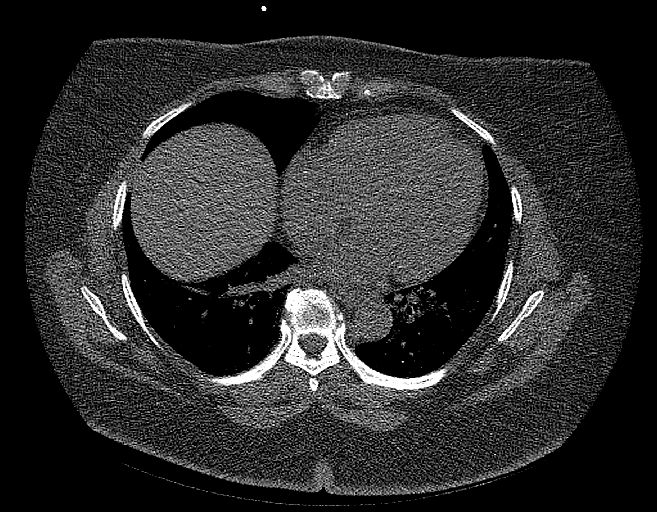
[im 25/50  vessel]
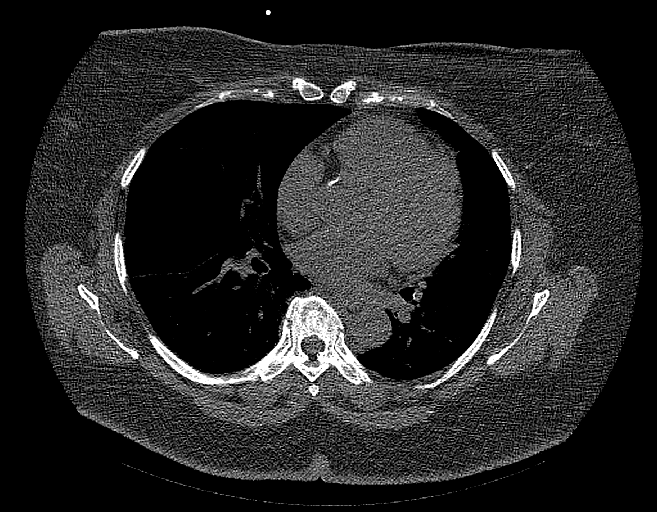
[im 33/50  vessel]
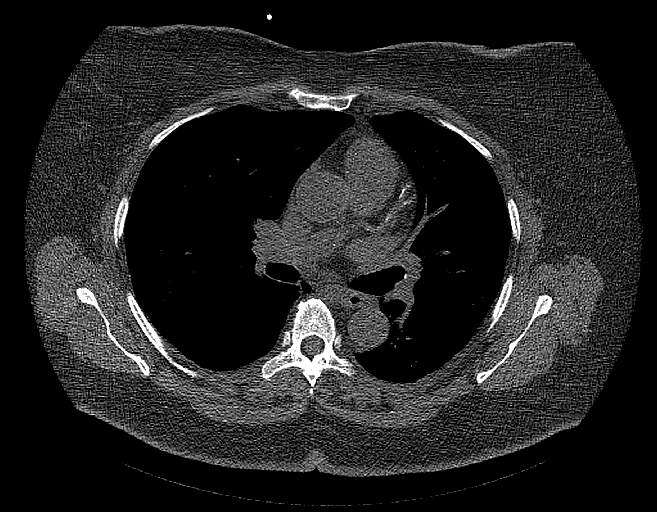
[im 41/50  vessel]
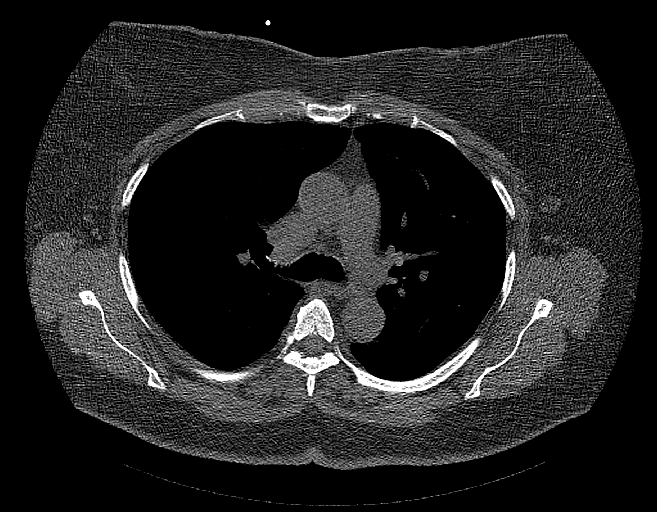

[14 of 20 positions shown; findings below may reference images not displayed]

FINDINGS: CORONARY CALCIUM SCORES:

Left Main: 0

LAD:

LCx:

RCA:

Total Agatston Score: 154

[HOSPITAL] percentile: 85

AORTA MEASUREMENTS:

Ascending Aorta: 30 mm

Descending Aorta: 24 mm

OTHER FINDINGS:

Heart is normal size. Aorta normal caliber. Scattered moderate
aortic calcifications. No adenopathy. No confluent airspace
opacities or effusions. No acute findings in the upper abdomen.
Chest wall soft tissues are unremarkable. No acute bony abnormality.
IMPRESSION: Total Agatston score: 154

[HOSPITAL] percentile: 85

Aortic atherosclerosis.

## 2024-01-14 DIAGNOSIS — G4733 Obstructive sleep apnea (adult) (pediatric): Secondary | ICD-10-CM | POA: Diagnosis not present

## 2024-04-05 ENCOUNTER — Other Ambulatory Visit: Payer: Self-pay

## 2024-04-05 MED ORDER — EZETIMIBE 10 MG PO TABS: 10.0000 mg | ORAL_TABLET | Freq: Every day | ORAL | 0 refills | Status: DC

## 2024-04-16 DIAGNOSIS — E139 Other specified diabetes mellitus without complications: Secondary | ICD-10-CM | POA: Diagnosis not present

## 2024-04-16 DIAGNOSIS — L84 Corns and callosities: Secondary | ICD-10-CM | POA: Diagnosis not present

## 2024-04-16 DIAGNOSIS — G4733 Obstructive sleep apnea (adult) (pediatric): Secondary | ICD-10-CM | POA: Diagnosis not present

## 2024-04-16 DIAGNOSIS — L600 Ingrowing nail: Secondary | ICD-10-CM | POA: Diagnosis not present

## 2024-04-17 ENCOUNTER — Other Ambulatory Visit: Payer: Self-pay | Admitting: Physician Assistant

## 2024-04-17 DIAGNOSIS — E1165 Type 2 diabetes mellitus with hyperglycemia: Secondary | ICD-10-CM | POA: Diagnosis not present

## 2024-04-17 DIAGNOSIS — E7800 Pure hypercholesterolemia, unspecified: Secondary | ICD-10-CM | POA: Diagnosis not present

## 2024-04-24 DIAGNOSIS — E7800 Pure hypercholesterolemia, unspecified: Secondary | ICD-10-CM | POA: Diagnosis not present

## 2024-04-24 DIAGNOSIS — I10 Essential (primary) hypertension: Secondary | ICD-10-CM | POA: Diagnosis not present

## 2024-04-24 DIAGNOSIS — I700 Atherosclerosis of aorta: Secondary | ICD-10-CM | POA: Diagnosis not present

## 2024-04-24 DIAGNOSIS — Z789 Other specified health status: Secondary | ICD-10-CM | POA: Diagnosis not present

## 2024-04-24 DIAGNOSIS — Z800 Family history of malignant neoplasm of digestive organs: Secondary | ICD-10-CM | POA: Diagnosis not present

## 2024-04-24 DIAGNOSIS — R202 Paresthesia of skin: Secondary | ICD-10-CM | POA: Diagnosis not present

## 2024-04-24 DIAGNOSIS — E1165 Type 2 diabetes mellitus with hyperglycemia: Secondary | ICD-10-CM | POA: Diagnosis not present

## 2024-05-14 DIAGNOSIS — L600 Ingrowing nail: Secondary | ICD-10-CM | POA: Diagnosis not present

## 2024-05-14 DIAGNOSIS — L84 Corns and callosities: Secondary | ICD-10-CM | POA: Diagnosis not present

## 2024-05-14 DIAGNOSIS — M21621 Bunionette of right foot: Secondary | ICD-10-CM | POA: Diagnosis not present

## 2024-05-23 ENCOUNTER — Other Ambulatory Visit: Payer: Self-pay | Admitting: Physician Assistant

## 2024-06-15 ENCOUNTER — Other Ambulatory Visit: Payer: Self-pay | Admitting: Physician Assistant

## 2024-06-22 ENCOUNTER — Other Ambulatory Visit: Payer: Self-pay | Admitting: Obstetrics and Gynecology

## 2024-06-22 DIAGNOSIS — Z800 Family history of malignant neoplasm of digestive organs: Secondary | ICD-10-CM

## 2024-06-30 ENCOUNTER — Other Ambulatory Visit: Payer: Self-pay | Admitting: Physician Assistant

## 2024-07-16 DIAGNOSIS — E113211 Type 2 diabetes mellitus with mild nonproliferative diabetic retinopathy with macular edema, right eye: Secondary | ICD-10-CM | POA: Diagnosis not present

## 2024-07-16 DIAGNOSIS — H2513 Age-related nuclear cataract, bilateral: Secondary | ICD-10-CM | POA: Diagnosis not present

## 2024-07-16 DIAGNOSIS — H43813 Vitreous degeneration, bilateral: Secondary | ICD-10-CM | POA: Diagnosis not present

## 2024-07-16 DIAGNOSIS — E113292 Type 2 diabetes mellitus with mild nonproliferative diabetic retinopathy without macular edema, left eye: Secondary | ICD-10-CM | POA: Diagnosis not present

## 2024-07-18 ENCOUNTER — Other Ambulatory Visit: Payer: Self-pay | Admitting: Physician Assistant

## 2024-07-30 DIAGNOSIS — M7732 Calcaneal spur, left foot: Secondary | ICD-10-CM | POA: Diagnosis not present

## 2024-07-30 DIAGNOSIS — E139 Other specified diabetes mellitus without complications: Secondary | ICD-10-CM | POA: Diagnosis not present

## 2024-07-30 DIAGNOSIS — M7731 Calcaneal spur, right foot: Secondary | ICD-10-CM | POA: Diagnosis not present

## 2024-07-30 DIAGNOSIS — M21621 Bunionette of right foot: Secondary | ICD-10-CM | POA: Diagnosis not present

## 2024-07-30 DIAGNOSIS — M21622 Bunionette of left foot: Secondary | ICD-10-CM | POA: Diagnosis not present

## 2024-07-31 DIAGNOSIS — E1122 Type 2 diabetes mellitus with diabetic chronic kidney disease: Secondary | ICD-10-CM | POA: Diagnosis not present

## 2024-07-31 DIAGNOSIS — N1832 Chronic kidney disease, stage 3b: Secondary | ICD-10-CM | POA: Diagnosis not present

## 2024-07-31 DIAGNOSIS — I129 Hypertensive chronic kidney disease with stage 1 through stage 4 chronic kidney disease, or unspecified chronic kidney disease: Secondary | ICD-10-CM | POA: Diagnosis not present

## 2024-07-31 DIAGNOSIS — G4733 Obstructive sleep apnea (adult) (pediatric): Secondary | ICD-10-CM | POA: Diagnosis not present

## 2024-08-01 ENCOUNTER — Other Ambulatory Visit: Payer: Self-pay

## 2024-08-01 ENCOUNTER — Emergency Department (HOSPITAL_BASED_OUTPATIENT_CLINIC_OR_DEPARTMENT_OTHER)
Admission: EM | Admit: 2024-08-01 | Discharge: 2024-08-01 | Disposition: A | Discharge status: Home / Self Care | Attending: Emergency Medicine | Admitting: Emergency Medicine

## 2024-08-01 DIAGNOSIS — R0989 Other specified symptoms and signs involving the circulatory and respiratory systems: Secondary | ICD-10-CM | POA: Diagnosis present

## 2024-08-01 DIAGNOSIS — R519 Headache, unspecified: Secondary | ICD-10-CM | POA: Insufficient documentation

## 2024-08-01 DIAGNOSIS — Z794 Long term (current) use of insulin: Secondary | ICD-10-CM | POA: Diagnosis not present

## 2024-08-01 DIAGNOSIS — R0981 Nasal congestion: Secondary | ICD-10-CM | POA: Insufficient documentation

## 2024-08-01 DIAGNOSIS — R059 Cough, unspecified: Secondary | ICD-10-CM | POA: Insufficient documentation

## 2024-08-01 DIAGNOSIS — E119 Type 2 diabetes mellitus without complications: Secondary | ICD-10-CM | POA: Diagnosis not present

## 2024-08-01 DIAGNOSIS — I10 Essential (primary) hypertension: Secondary | ICD-10-CM | POA: Insufficient documentation

## 2024-08-01 DIAGNOSIS — Z79899 Other long term (current) drug therapy: Secondary | ICD-10-CM | POA: Insufficient documentation

## 2024-08-01 DIAGNOSIS — J069 Acute upper respiratory infection, unspecified: Secondary | ICD-10-CM | POA: Diagnosis not present

## 2024-08-01 DIAGNOSIS — Z7982 Long term (current) use of aspirin: Secondary | ICD-10-CM | POA: Insufficient documentation

## 2024-08-01 LAB — RESP PANEL BY RT-PCR (RSV, FLU A&B, COVID)  RVPGX2
Influenza A by PCR: NEGATIVE
Influenza B by PCR: NEGATIVE
Resp Syncytial Virus by PCR: NEGATIVE
SARS Coronavirus 2 by RT PCR: NEGATIVE

## 2024-08-01 MED ORDER — DOXYCYCLINE HYCLATE 100 MG PO CAPS: 100.0000 mg | ORAL_CAPSULE | Freq: Two times a day (BID) | ORAL | 0 refills | Status: AC

## 2024-08-01 NOTE — Discharge Instructions (Signed)
 Continue taking over-the-counter decongestants as needed for nasal drainage.  If symptoms are not improving in the next 2 to 3 days, fill the prescription for doxycycline you have been given this evening.

## 2024-08-01 NOTE — ED Triage Notes (Signed)
 Pt POV reporting persistent runny nose x1 day.

## 2024-08-01 NOTE — ED Provider Notes (Signed)
  EMERGENCY DEPARTMENT AT [facility] Provider Note   CSN: [id number] Arrival date & time: 08/01/24  2244     Patient presents with: No chief complaint on file.   Diane Jordan is a 73 y.o. female.   Patient is a 73 year old female with past medical history of hypertension, hyperlipidemia, GERD, diabetes.  Patient presenting today with complaints of runny nose.  This started this morning.  She reports going through an entire box of tissues and her nose is irritated.  She denies fevers or chills.  She has had some cough.  She reports some facial pain and reports a history of sinusitis.       Prior to Admission medications   Medication Sig Start Date End Date Taking? Authorizing Provider  aspirin  81 MG EC tablet 1 tablet    [provider]  chlorthalidone (HYGROTON) 25 MG tablet Take 25 mg by mouth every morning. 05/29/22   [provider]  ezetimibe  (ZETIA ) 10 MG tablet TAKE 1 TABLET BY MOUTH EVERY DAY 06/15/24   Lelon Hamilton T, PA-C  famotidine (PEPCID) 20 MG tablet famotidine 20 mg tablet  TAKE 1 TABLET BY MOUTH TWICE A DAY 01/22/20   [provider]  fexofenadine (ALLEGRA) 30 MG tablet Take 30 mg by mouth daily.    [provider]  hydrOXYzine (ATARAX/VISTARIL) 25 MG tablet Take 25 mg by mouth as needed.    [provider]  Insulin  Lispro Prot & Lispro (HUMALOG MIX 75/25 KWIKPEN) (75-25) 100 UNIT/ML Kwikpen 20 AM AND 20 PM 02/22/22   [provider]  irbesartan (AVAPRO) 150 MG tablet Take 150 mg by mouth daily. 06/09/22   [provider]  JARDIANCE 25 MG TABS tablet Take 25 mg by mouth daily. 06/14/22   [provider]  meclizine  (ANTIVERT ) 25 MG tablet Take 1 tablet (25 mg total) by mouth 3 (three) times daily as needed for dizziness. 03/22/22   Eudelia Maude SAUNDERS, PA-C  MOUNJARO 7.5 MG/0.5ML Pen  12/27/22   [provider]  Multiple Vitamin (MULTIVITAMIN WITH MINERALS) TABS Take 1  tablet by mouth daily.    [provider]    Allergies: Capsules, empty gelatin [gelatin]; Fluticasone propionate; and Prinivil [lisinopril]    Review of Systems  All other systems reviewed and are negative.   Updated Vital Signs BP (!) 160/76   Pulse 69   Temp 97.7 F (36.5 C) (Oral)   Resp 17   Ht 5' 5 (1.651 m)   Wt 97.1 kg   SpO2 97%   BMI 35.61 kg/m   Physical Exam Vitals and nursing note reviewed.  Constitutional:      General: She is not in acute distress.    Appearance: She is well-developed. She is not diaphoretic.  HENT:     Head: Normocephalic and atraumatic.  Cardiovascular:     Rate and Rhythm: Normal rate and regular rhythm.     Heart sounds: No murmur heard.    No friction rub. No gallop.  Pulmonary:     Effort: Pulmonary effort is normal. No respiratory distress.     Breath sounds: Normal breath sounds. No wheezing.  Musculoskeletal:        General: Normal range of motion.     Cervical back: Normal range of motion and neck supple.  Skin:    General: Skin is warm and dry.  Neurological:     General: No focal deficit present.     Mental Status: She is alert and  oriented to person, place, and time.     (all labs ordered are listed, but only abnormal results are displayed) Labs Reviewed  RESP PANEL BY RT-PCR (RSV, FLU A&B, COVID)  [id number]    EKG: None  Radiology: No results found.   Procedures   Medications Ordered in the ED - No data to display                                  Medical Decision Making  Patient presenting with runny nose and nasal congestion as described in HPI.  She has had little relief with over-the-counter medications.  COVID/flu/RSV is all negative.  Physical examination basically unremarkable.  Patient concerned that this is the start of a sinus infection.  I do suspect a viral etiology, but will prescribe an antibiotic which she can fill if not improving or if she worsens in the next few days.  To continue  over-the-counter decongestants for now.     Final diagnoses:  None    ED Discharge Orders     None          Geroldine Berg, MD 08/01/24 2345

## 2024-08-03 DIAGNOSIS — M21621 Bunionette of right foot: Secondary | ICD-10-CM | POA: Diagnosis not present

## 2024-08-09 ENCOUNTER — Encounter: Payer: Self-pay | Admitting: Obstetrics and Gynecology

## 2024-08-17 DIAGNOSIS — Z23 Encounter for immunization: Secondary | ICD-10-CM | POA: Diagnosis not present

## 2024-08-25 ENCOUNTER — Inpatient Hospital Stay: Admission: RE | Admit: 2024-08-25 | Source: Ambulatory Visit

## 2024-08-28 ENCOUNTER — Ambulatory Visit: Payer: Managed Care, Other (non HMO) | Admitting: Adult Health

## 2024-08-28 ENCOUNTER — Encounter: Payer: Self-pay | Admitting: Adult Health

## 2024-08-28 VITALS — BP 136/84 | HR 89 | Ht 64.0 in | Wt 220.0 lb

## 2024-08-28 DIAGNOSIS — R251 Tremor, unspecified: Secondary | ICD-10-CM

## 2024-08-28 DIAGNOSIS — G4733 Obstructive sleep apnea (adult) (pediatric): Secondary | ICD-10-CM

## 2024-08-28 NOTE — Progress Notes (Signed)
 [facility] [address] [place]. [place] [address] [phone]       OFFICE FOLLOW UP NOTE  Ms. Gustav GORMAN Pizza Date of Birth:  [birth year] Medical Record Number:  [id number]    Primary neurologist: Dr. Buck Reason for visit: CPAP and tremor follow-up    SUBJECTIVE:   CHIEF COMPLAINT:  Chief Complaint  Patient presents with   Sleep Apnea    Rm 3 alone Pt is well and stable, reports she hasn't been able to use cpap due to strap being accidentally thrown away. No other concerns.     Follow-up visit:  Prior visit: 08/25/2023  Brief HPI:   ANGLINE SCHWEIGERT is a 73 y.o. female who is followed in office for OSA on CPAP and intermittent tremors.  She was initially evaluated by Dr. Buck in 2022 for tremor consult, noted snoring and nonrestorative sleep and concern of possible sleep apnea. Recommended continued monitoring of tremor, no treatment required. Sleep study 12/2020 showed moderate to severe sleep apnea and AutoPap initiated on 06/09/2021.   At prior visit, noted adequate compliance with residual AHI 5.7. previously requested pressure setting changes for suboptimal residual AHI but settings never changed through DME. Noted approximately 20 pound weight loss likely improving residual AHI.    Interval history:  Reports she has not been able to use CPAP in the past several weeks as she accidentally discarded her headgear.  She contacted the DME and advised she was not eligible for new supplies until December through insurance, she question out-of-pocket price but has not been able to get back in touch with DME. Prior to this, she was doing very well with CPAP and tolerating without difficulty.  ESS 1/24.   Compliance report obtained from 06/01/2024 -07/02/2024 which shows 100% compliance.  Residual AHI 4.2.  Pressure in the 93rd percentile 7.3.  Pressure setting of 7 with EPR 2.  Leaks in the 95th percentile 0.  She is currently using a large air touch F20 mask,  Meagan Allen (sleep production designer, theatre/television/film) was able to provide patient with new mask in order to get restarted.   Tremors stable without worsening. Typically present with lack of sleep, at times can affect her writing.        ROS:   14 system review of systems performed and negative with exception of those listed in HPI  PMH:  Past Medical History:  Diagnosis Date   Arthritis    DM (diabetes mellitus) (HCC)    HTN (hypertension)    Postmenopausal     PSH:  Past Surgical History:  Procedure Laterality Date   TUBAL LIGATION     1982    Social History:  Social History   Socioeconomic History   Marital status: Married    Spouse name: Not on file   Number of children: 3   Years of education: Not on file   Highest education level: Not on file  Occupational History    Employer: [employer]  Tobacco Use   Smoking status: Never   Smokeless tobacco: Never  Vaping Use   Vaping status: Never Used  Substance and Sexual Activity   Alcohol use: No   Drug use: No   Sexual activity: Yes    Partners: Male    Birth control/protection: Pill, Surgical, Post-menopausal  Other Topics Concern   Not on file  Social History Narrative   Not on file   Social Drivers of Health   Financial Resource Strain: Not on file  Food Insecurity:  Not on file  Transportation Needs: Not on file  Physical Activity: Not on file  Stress: Not on file  Social Connections: Not on file  Intimate Partner Violence: Not on file    Family History:  Family History  Problem Relation Age of Onset   Pulmonary fibrosis Mother    Heart disease Mother    High blood pressure Father    Diabetes Father    Sleep apnea Neg Hx     Medications:   Current Outpatient Medications on File Prior to Visit  Medication Sig Dispense Refill   aspirin  81 MG EC tablet 1 tablet     doxycycline (VIBRAMYCIN) 100 MG capsule Take 1 capsule (100 mg total) by mouth 2 (two) times daily. One po bid x 7 days 14 capsule 0   ezetimibe   (ZETIA ) 10 MG tablet TAKE 1 TABLET BY MOUTH EVERY DAY 90 tablet 0   famotidine (PEPCID) 20 MG tablet famotidine 20 mg tablet  TAKE 1 TABLET BY MOUTH TWICE A DAY     fexofenadine (ALLEGRA) 30 MG tablet Take 30 mg by mouth daily.     hydrOXYzine (ATARAX/VISTARIL) 25 MG tablet Take 25 mg by mouth as needed.     irbesartan (AVAPRO) 150 MG tablet Take 150 mg by mouth daily.     JARDIANCE 25 MG TABS tablet Take 25 mg by mouth daily.     meclizine  (ANTIVERT ) 25 MG tablet Take 1 tablet (25 mg total) by mouth 3 (three) times daily as needed for dizziness. 30 tablet 0   MOUNJARO 10 MG/0.5ML Pen INJECT 10MG  SUBCUTANEOUS ONCE A WEEK 28 DAYS     Multiple Vitamin (MULTIVITAMIN WITH MINERALS) TABS Take 1 tablet by mouth daily.     NOVOLOG  MIX 70/30 FLEXPEN (70-30) 100 UNIT/ML FlexPen 15u twice a day Subcutaneous twice a day; Duration: 30 days     No current facility-administered medications on file prior to visit.    Allergies:   Allergies  Allergen Reactions   Capsules, Empty Gelatin [Gelatin]     Any capsule form med causes itching.    Fluticasone Propionate Itching   Prinivil [Lisinopril] Itching      OBJECTIVE:  Physical Exam  Vitals:   08/28/24 1453  BP: 136/84  Pulse: 89  Weight: 220 lb (99.8 kg)  Height: 5' 4 (1.626 m)   Body mass index is 37.76 kg/m. No results found.  General: well developed, well nourished, very pleasant elderly African-American female, seated, in no evident distress Head: head normocephalic and atraumatic.   Neck: supple with no carotid or supraclavicular bruits Cardiovascular: regular rate and rhythm, no murmurs  Neurologic Exam Mental Status: Awake and fully alert. Oriented to place and time. Recent and remote memory intact. Attention span, concentration and fund of knowledge appropriate. Mood and affect appropriate.  Cranial Nerves: Pupils equal, briskly reactive to light. Extraocular movements full without nystagmus. Visual fields full to  confrontation. Hearing intact. Facial sensation intact. Face, tongue, palate moves normally and symmetrically.  Motor: Normal bulk and tone. Normal strength in all tested extremity muscles. Mild bilateral intention tremor Gait and Station: Arises from chair without difficulty. Stance is normal. Gait demonstrates normal stride length and balance without use of AD. Tandem walk and heel toe without difficulty.          ASSESSMENT/PLAN: MELICIA ESQUEDA is a 73 y.o. year old female    OSA on CPAP :  Compliance report shows satisfactory usage from August with optimal residual AHI Has not been able  to use in the past month as she accidentally discarded her head gear, she was supplied with new mask today as unable to obtain new mask through insurance until December Continue current pressure setting of 7 with EPR 2 Discussed importance of continuing nightly usage with ensuring greater than 4 hours per night for optimal benefit Continue to follow with DME [employer] [employer] for any supplies or CPAP related concerns CPAP set up 06/2021, [id number] - requires card download  Tremor: stable. Only present with fatigue or occasionally when writing. Discussed importance of adequate sleep duration.Advised to call with any worsening symptoms.     Follow up in 1 year or call earlier if needed   CC:  PCP: Regino Slater, MD     Harlene Bogaert, AGNP-BC  [facility] [address] [place], [place] [address]  Phone [phone] Fax [phone] Note: This document was prepared with digital dictation and possible smart phrase technology. Any transcriptional errors that result from this process are unintentional.

## 2024-08-29 NOTE — Progress Notes (Signed)
 Community message has been sent to [employer] for pressure and supplies on 08/29/24. DD

## 2024-08-30 ENCOUNTER — Other Ambulatory Visit: Payer: Self-pay | Admitting: Physician Assistant

## 2024-09-24 ENCOUNTER — Other Ambulatory Visit: Payer: Self-pay | Admitting: Physician Assistant

## 2024-09-26 ENCOUNTER — Encounter: Payer: Self-pay | Admitting: Obstetrics and Gynecology

## 2024-10-13 ENCOUNTER — Ambulatory Visit
Admission: RE | Admit: 2024-10-13 | Discharge: 2024-10-13 | Disposition: A | Discharge status: Home / Self Care | Source: Ambulatory Visit | Attending: Obstetrics and Gynecology | Admitting: Obstetrics and Gynecology

## 2024-10-13 DIAGNOSIS — Z800 Family history of malignant neoplasm of digestive organs: Secondary | ICD-10-CM

## 2024-10-13 MED ORDER — GADOPICLENOL 0.5 MMOL/ML IV SOLN
10.0000 mL | Freq: Once | INTRAVENOUS | Status: AC | PRN
  Administered 2024-10-13: 17:00:00 10 mL via INTRAVENOUS

## 2024-10-19 ENCOUNTER — Other Ambulatory Visit: Payer: Self-pay | Admitting: Physician Assistant

## 2024-11-05 ENCOUNTER — Other Ambulatory Visit: Payer: Self-pay | Admitting: Physician Assistant

## 2024-11-18 ENCOUNTER — Other Ambulatory Visit: Payer: Self-pay | Admitting: Physician Assistant

## 2024-11-21 NOTE — Telephone Encounter (Signed)
 Labs Completed on 10/22/24 [facility]

## 2024-12-07 ENCOUNTER — Other Ambulatory Visit: Payer: Self-pay | Admitting: Physician Assistant

## 2025-08-28 ENCOUNTER — Ambulatory Visit: Admitting: Adult Health
